# Patient Record
Sex: Female | Born: 1980
Health system: Southern US, Community
[De-identification: ages and names within clinical notes are randomized; demographics above are authoritative.]

## PROBLEM LIST (undated history)

## (undated) DIAGNOSIS — E079 Disorder of thyroid, unspecified: Secondary | ICD-10-CM

## (undated) DIAGNOSIS — J45909 Unspecified asthma, uncomplicated: Secondary | ICD-10-CM

## (undated) HISTORY — PX: CERVICAL BIOPSY  W/ LOOP ELECTRODE EXCISION: SUR135

## (undated) HISTORY — PX: LASIK: SHX215

## (undated) HISTORY — PX: OTHER SURGICAL HISTORY: SHX169

## (undated) HISTORY — PX: WISDOM TOOTH EXTRACTION: SHX21

---

## 2000-05-16 ENCOUNTER — Inpatient Hospital Stay (HOSPITAL_COMMUNITY): Admission: AD | Admit: 2000-05-16 | Discharge: 2000-05-16 | Payer: Self-pay | Admitting: Obstetrics & Gynecology

## 2000-07-19 ENCOUNTER — Encounter: Payer: Self-pay | Admitting: Emergency Medicine

## 2000-07-19 ENCOUNTER — Emergency Department (HOSPITAL_COMMUNITY): Admission: EM | Admit: 2000-07-19 | Discharge: 2000-07-19 | Payer: Self-pay | Admitting: Emergency Medicine

## 2000-08-14 ENCOUNTER — Ambulatory Visit (HOSPITAL_COMMUNITY): Admission: RE | Admit: 2000-08-14 | Discharge: 2000-08-14 | Payer: Self-pay | Admitting: *Deleted

## 2000-10-28 ENCOUNTER — Inpatient Hospital Stay (HOSPITAL_COMMUNITY): Admission: AD | Admit: 2000-10-28 | Discharge: 2000-10-28 | Payer: Self-pay | Admitting: *Deleted

## 2000-12-11 ENCOUNTER — Inpatient Hospital Stay (HOSPITAL_COMMUNITY): Admission: AD | Admit: 2000-12-11 | Discharge: 2000-12-11 | Payer: Self-pay | Admitting: Obstetrics

## 2000-12-27 ENCOUNTER — Ambulatory Visit (HOSPITAL_COMMUNITY): Admission: RE | Admit: 2000-12-27 | Discharge: 2000-12-27 | Payer: Self-pay | Admitting: *Deleted

## 2001-01-02 ENCOUNTER — Encounter (HOSPITAL_COMMUNITY): Admission: RE | Admit: 2001-01-02 | Discharge: 2001-01-18 | Payer: Self-pay | Admitting: Obstetrics & Gynecology

## 2001-01-16 ENCOUNTER — Inpatient Hospital Stay (HOSPITAL_COMMUNITY): Admission: AD | Admit: 2001-01-16 | Discharge: 2001-01-20 | Payer: Self-pay | Admitting: Obstetrics & Gynecology

## 2001-01-16 ENCOUNTER — Encounter (INDEPENDENT_AMBULATORY_CARE_PROVIDER_SITE_OTHER): Payer: Self-pay

## 2001-01-21 ENCOUNTER — Encounter: Admission: RE | Admit: 2001-01-21 | Discharge: 2001-02-20 | Payer: Self-pay | Admitting: Obstetrics & Gynecology

## 2001-01-23 ENCOUNTER — Inpatient Hospital Stay (HOSPITAL_COMMUNITY): Admission: AD | Admit: 2001-01-23 | Discharge: 2001-01-23 | Payer: Self-pay | Admitting: Obstetrics & Gynecology

## 2001-05-07 ENCOUNTER — Inpatient Hospital Stay (HOSPITAL_COMMUNITY): Admission: AD | Admit: 2001-05-07 | Discharge: 2001-05-07 | Payer: Self-pay | Admitting: Obstetrics

## 2001-05-07 ENCOUNTER — Encounter: Payer: Self-pay | Admitting: Obstetrics

## 2001-07-04 ENCOUNTER — Emergency Department (HOSPITAL_COMMUNITY): Admission: EM | Admit: 2001-07-04 | Discharge: 2001-07-04 | Payer: Self-pay | Admitting: Emergency Medicine

## 2001-07-31 ENCOUNTER — Emergency Department (HOSPITAL_COMMUNITY): Admission: EM | Admit: 2001-07-31 | Discharge: 2001-07-31 | Payer: Self-pay | Admitting: Emergency Medicine

## 2001-08-25 ENCOUNTER — Other Ambulatory Visit: Admission: RE | Admit: 2001-08-25 | Discharge: 2001-08-25 | Payer: Self-pay | Admitting: Obstetrics and Gynecology

## 2001-09-10 ENCOUNTER — Encounter: Admission: RE | Admit: 2001-09-10 | Discharge: 2001-12-09 | Payer: Self-pay | Admitting: Obstetrics and Gynecology

## 2001-10-20 ENCOUNTER — Inpatient Hospital Stay (HOSPITAL_COMMUNITY): Admission: AD | Admit: 2001-10-20 | Discharge: 2001-10-20 | Payer: Self-pay | Admitting: Obstetrics and Gynecology

## 2001-11-06 ENCOUNTER — Encounter (HOSPITAL_COMMUNITY): Admission: RE | Admit: 2001-11-06 | Discharge: 2001-12-03 | Payer: Self-pay | Admitting: Obstetrics and Gynecology

## 2001-11-20 ENCOUNTER — Encounter: Payer: Self-pay | Admitting: Obstetrics and Gynecology

## 2001-11-24 ENCOUNTER — Inpatient Hospital Stay (HOSPITAL_COMMUNITY): Admission: AD | Admit: 2001-11-24 | Discharge: 2001-11-24 | Payer: Self-pay | Admitting: Obstetrics and Gynecology

## 2001-12-03 ENCOUNTER — Encounter: Payer: Self-pay | Admitting: Obstetrics and Gynecology

## 2001-12-04 ENCOUNTER — Encounter (INDEPENDENT_AMBULATORY_CARE_PROVIDER_SITE_OTHER): Payer: Self-pay | Admitting: Specialist

## 2001-12-04 ENCOUNTER — Inpatient Hospital Stay (HOSPITAL_COMMUNITY): Admission: AD | Admit: 2001-12-04 | Discharge: 2001-12-06 | Payer: Self-pay | Admitting: Obstetrics and Gynecology

## 2002-09-10 ENCOUNTER — Emergency Department (HOSPITAL_COMMUNITY): Admission: EM | Admit: 2002-09-10 | Discharge: 2002-09-10 | Payer: Self-pay | Admitting: Emergency Medicine

## 2002-09-10 ENCOUNTER — Encounter: Payer: Self-pay | Admitting: Emergency Medicine

## 2003-03-13 ENCOUNTER — Emergency Department (HOSPITAL_COMMUNITY): Admission: EM | Admit: 2003-03-13 | Discharge: 2003-03-14 | Payer: Self-pay | Admitting: Emergency Medicine

## 2003-03-14 ENCOUNTER — Encounter: Payer: Self-pay | Admitting: Emergency Medicine

## 2003-04-01 ENCOUNTER — Other Ambulatory Visit: Admission: RE | Admit: 2003-04-01 | Discharge: 2003-04-01 | Payer: Self-pay | Admitting: Obstetrics and Gynecology

## 2003-04-02 ENCOUNTER — Other Ambulatory Visit: Admission: RE | Admit: 2003-04-02 | Discharge: 2003-04-02 | Payer: Self-pay | Admitting: Gynecology

## 2003-07-27 ENCOUNTER — Ambulatory Visit (HOSPITAL_COMMUNITY): Admission: RE | Admit: 2003-07-27 | Discharge: 2003-07-27 | Payer: Self-pay | Admitting: Obstetrics and Gynecology

## 2003-07-27 ENCOUNTER — Other Ambulatory Visit: Admission: RE | Admit: 2003-07-27 | Discharge: 2003-07-27 | Payer: Self-pay | Admitting: Obstetrics and Gynecology

## 2003-09-08 ENCOUNTER — Ambulatory Visit (HOSPITAL_COMMUNITY): Admission: RE | Admit: 2003-09-08 | Discharge: 2003-09-08 | Payer: Self-pay | Admitting: Obstetrics and Gynecology

## 2003-10-01 ENCOUNTER — Inpatient Hospital Stay (HOSPITAL_COMMUNITY): Admission: AD | Admit: 2003-10-01 | Discharge: 2003-10-04 | Payer: Self-pay | Admitting: Obstetrics and Gynecology

## 2004-02-21 ENCOUNTER — Emergency Department (HOSPITAL_COMMUNITY): Admission: EM | Admit: 2004-02-21 | Discharge: 2004-02-21 | Payer: Self-pay | Admitting: Emergency Medicine

## 2004-12-25 ENCOUNTER — Inpatient Hospital Stay (HOSPITAL_COMMUNITY): Admission: AD | Admit: 2004-12-25 | Discharge: 2004-12-25 | Payer: Self-pay | Admitting: Obstetrics & Gynecology

## 2005-01-30 ENCOUNTER — Inpatient Hospital Stay (HOSPITAL_COMMUNITY): Admission: AD | Admit: 2005-01-30 | Discharge: 2005-01-30 | Payer: Self-pay | Admitting: *Deleted

## 2005-04-01 ENCOUNTER — Ambulatory Visit: Payer: Self-pay | Admitting: Certified Nurse Midwife

## 2005-04-01 ENCOUNTER — Inpatient Hospital Stay (HOSPITAL_COMMUNITY): Admission: AD | Admit: 2005-04-01 | Discharge: 2005-04-01 | Payer: Self-pay | Admitting: *Deleted

## 2005-04-03 ENCOUNTER — Ambulatory Visit: Payer: Self-pay | Admitting: *Deleted

## 2005-04-08 ENCOUNTER — Inpatient Hospital Stay (HOSPITAL_COMMUNITY): Admission: AD | Admit: 2005-04-08 | Discharge: 2005-04-11 | Payer: Self-pay | Admitting: Obstetrics & Gynecology

## 2005-04-08 ENCOUNTER — Encounter (INDEPENDENT_AMBULATORY_CARE_PROVIDER_SITE_OTHER): Payer: Self-pay | Admitting: *Deleted

## 2005-04-08 ENCOUNTER — Ambulatory Visit: Payer: Self-pay | Admitting: Obstetrics & Gynecology

## 2005-05-10 ENCOUNTER — Encounter: Payer: Self-pay | Admitting: Family Medicine

## 2005-05-10 ENCOUNTER — Ambulatory Visit: Payer: Self-pay | Admitting: Family Medicine

## 2005-05-14 ENCOUNTER — Inpatient Hospital Stay (HOSPITAL_COMMUNITY): Admission: AD | Admit: 2005-05-14 | Discharge: 2005-05-14 | Payer: Self-pay | Admitting: Obstetrics and Gynecology

## 2005-05-16 ENCOUNTER — Ambulatory Visit: Payer: Self-pay | Admitting: Obstetrics & Gynecology

## 2005-05-16 ENCOUNTER — Inpatient Hospital Stay (HOSPITAL_COMMUNITY): Admission: AD | Admit: 2005-05-16 | Discharge: 2005-05-16 | Payer: Self-pay | Admitting: Obstetrics & Gynecology

## 2005-05-23 ENCOUNTER — Ambulatory Visit: Payer: Self-pay | Admitting: Family Medicine

## 2005-06-20 ENCOUNTER — Ambulatory Visit: Payer: Self-pay | Admitting: Family Medicine

## 2005-06-25 ENCOUNTER — Ambulatory Visit: Payer: Self-pay | Admitting: Sports Medicine

## 2005-09-18 ENCOUNTER — Ambulatory Visit: Payer: Self-pay | Admitting: Family Medicine

## 2006-06-03 ENCOUNTER — Ambulatory Visit: Payer: Self-pay | Admitting: Family Medicine

## 2006-06-04 ENCOUNTER — Ambulatory Visit: Payer: Self-pay | Admitting: Family Medicine

## 2006-06-27 ENCOUNTER — Ambulatory Visit: Payer: Self-pay | Admitting: Sports Medicine

## 2006-07-29 ENCOUNTER — Ambulatory Visit: Payer: Self-pay | Admitting: Family Medicine

## 2006-08-20 ENCOUNTER — Emergency Department (HOSPITAL_COMMUNITY): Admission: EM | Admit: 2006-08-20 | Discharge: 2006-08-20 | Payer: Self-pay | Admitting: Emergency Medicine

## 2006-10-17 ENCOUNTER — Ambulatory Visit: Payer: Self-pay | Admitting: Sports Medicine

## 2006-12-05 DIAGNOSIS — D509 Iron deficiency anemia, unspecified: Secondary | ICD-10-CM

## 2006-12-05 DIAGNOSIS — F329 Major depressive disorder, single episode, unspecified: Secondary | ICD-10-CM

## 2006-12-05 DIAGNOSIS — J309 Allergic rhinitis, unspecified: Secondary | ICD-10-CM | POA: Insufficient documentation

## 2006-12-05 DIAGNOSIS — L708 Other acne: Secondary | ICD-10-CM | POA: Insufficient documentation

## 2006-12-05 DIAGNOSIS — F411 Generalized anxiety disorder: Secondary | ICD-10-CM | POA: Insufficient documentation

## 2007-05-22 ENCOUNTER — Emergency Department (HOSPITAL_COMMUNITY): Admission: EM | Admit: 2007-05-22 | Discharge: 2007-05-22 | Payer: Self-pay | Admitting: Emergency Medicine

## 2007-08-01 ENCOUNTER — Encounter (INDEPENDENT_AMBULATORY_CARE_PROVIDER_SITE_OTHER): Payer: Self-pay | Admitting: Family Medicine

## 2007-08-21 ENCOUNTER — Encounter (INDEPENDENT_AMBULATORY_CARE_PROVIDER_SITE_OTHER): Payer: Self-pay | Admitting: Family Medicine

## 2007-08-28 ENCOUNTER — Encounter (INDEPENDENT_AMBULATORY_CARE_PROVIDER_SITE_OTHER): Payer: Self-pay | Admitting: Family Medicine

## 2007-08-28 ENCOUNTER — Ambulatory Visit: Payer: Self-pay | Admitting: Family Medicine

## 2007-08-28 LAB — CONVERTED CEMR LAB
ALT: 10 units/L (ref 0–35)
AST: 14 units/L (ref 0–37)
Albumin: 4.6 g/dL (ref 3.5–5.2)
BUN: 9 mg/dL (ref 6–23)
Basophils Absolute: 0 10*3/uL (ref 0.0–0.1)
Basophils Relative: 1 % (ref 0–1)
Calcium: 9.6 mg/dL (ref 8.4–10.5)
Chloride: 105 meq/L (ref 96–112)
Creatinine, Ser: 0.72 mg/dL (ref 0.40–1.20)
Eosinophils Relative: 2 % (ref 0–5)
Glucose, Urine, Semiquant: NEGATIVE
HCT: 45 % (ref 36.0–46.0)
Ketones, urine, test strip: NEGATIVE
MCHC: 34 g/dL (ref 30.0–36.0)
MCV: 90 fL (ref 78.0–100.0)
Neutro Abs: 5.5 10*3/uL (ref 1.7–7.7)
Neutrophils Relative %: 62 % (ref 43–77)
Nitrite: POSITIVE
Platelets: 215 10*3/uL (ref 150–400)
Potassium: 4.3 meq/L (ref 3.5–5.3)
Sodium: 137 meq/L (ref 135–145)
WBC: 8.7 10*3/uL (ref 4.0–10.5)

## 2007-08-29 ENCOUNTER — Encounter: Admission: RE | Admit: 2007-08-29 | Discharge: 2007-08-29 | Payer: Self-pay | Admitting: Sports Medicine

## 2007-09-01 ENCOUNTER — Telehealth: Payer: Self-pay | Admitting: *Deleted

## 2007-09-03 ENCOUNTER — Ambulatory Visit: Payer: Self-pay | Admitting: Family Medicine

## 2007-09-03 ENCOUNTER — Encounter (INDEPENDENT_AMBULATORY_CARE_PROVIDER_SITE_OTHER): Payer: Self-pay | Admitting: Family Medicine

## 2007-09-03 DIAGNOSIS — F17200 Nicotine dependence, unspecified, uncomplicated: Secondary | ICD-10-CM

## 2007-09-03 LAB — CONVERTED CEMR LAB
ALT: 10 units/L (ref 0–35)
Albumin: 4.5 g/dL (ref 3.5–5.2)
Chlamydia, DNA Probe: NEGATIVE
Ferritin: 55 ng/mL (ref 10–291)
GC Probe Amp, Genital: NEGATIVE
HCT: 41.5 % (ref 36.0–46.0)
Hemoglobin: 14.2 g/dL (ref 12.0–15.0)
Iron: 96 ug/dL (ref 42–145)
MCHC: 34.2 g/dL (ref 30.0–36.0)
Potassium: 4.4 meq/L (ref 3.5–5.3)
RDW: 12.1 % (ref 11.5–15.5)
Sodium: 139 meq/L (ref 135–145)
Total Bilirubin: 0.3 mg/dL (ref 0.3–1.2)

## 2008-01-14 ENCOUNTER — Telehealth (INDEPENDENT_AMBULATORY_CARE_PROVIDER_SITE_OTHER): Payer: Self-pay | Admitting: *Deleted

## 2008-01-14 ENCOUNTER — Telehealth (INDEPENDENT_AMBULATORY_CARE_PROVIDER_SITE_OTHER): Payer: Self-pay | Admitting: Family Medicine

## 2008-01-14 ENCOUNTER — Encounter: Admission: RE | Admit: 2008-01-14 | Discharge: 2008-01-14 | Payer: Self-pay | Admitting: Family Medicine

## 2008-01-14 ENCOUNTER — Ambulatory Visit: Payer: Self-pay | Admitting: Sports Medicine

## 2008-01-21 ENCOUNTER — Ambulatory Visit: Payer: Self-pay | Admitting: Family Medicine

## 2008-02-03 ENCOUNTER — Encounter (INDEPENDENT_AMBULATORY_CARE_PROVIDER_SITE_OTHER): Payer: Self-pay | Admitting: Family Medicine

## 2008-04-02 ENCOUNTER — Emergency Department (HOSPITAL_COMMUNITY): Admission: EM | Admit: 2008-04-02 | Discharge: 2008-04-03 | Payer: Self-pay | Admitting: Emergency Medicine

## 2008-08-20 ENCOUNTER — Inpatient Hospital Stay (HOSPITAL_COMMUNITY): Admission: EM | Admit: 2008-08-20 | Discharge: 2008-08-21 | Payer: Self-pay | Admitting: Emergency Medicine

## 2008-08-20 ENCOUNTER — Encounter: Payer: Self-pay | Admitting: Family Medicine

## 2008-08-20 ENCOUNTER — Encounter: Payer: Self-pay | Admitting: Emergency Medicine

## 2008-08-21 ENCOUNTER — Encounter: Payer: Self-pay | Admitting: Family Medicine

## 2008-08-21 LAB — CONVERTED CEMR LAB
Creatinine, Ser: 0.63 mg/dL
LDL Cholesterol: 86 mg/dL
Triglycerides: 80 mg/dL

## 2008-08-26 ENCOUNTER — Ambulatory Visit: Payer: Self-pay | Admitting: Family Medicine

## 2008-08-26 ENCOUNTER — Encounter: Payer: Self-pay | Admitting: *Deleted

## 2008-09-25 ENCOUNTER — Encounter: Payer: Self-pay | Admitting: Family Medicine

## 2008-09-27 ENCOUNTER — Encounter (INDEPENDENT_AMBULATORY_CARE_PROVIDER_SITE_OTHER): Payer: Self-pay | Admitting: *Deleted

## 2008-10-05 ENCOUNTER — Ambulatory Visit: Payer: Self-pay | Admitting: Family Medicine

## 2008-10-05 ENCOUNTER — Encounter: Payer: Self-pay | Admitting: Family Medicine

## 2008-10-05 ENCOUNTER — Encounter (INDEPENDENT_AMBULATORY_CARE_PROVIDER_SITE_OTHER): Payer: Self-pay | Admitting: Family Medicine

## 2008-10-05 LAB — CONVERTED CEMR LAB
GC Probe Amp, Genital: NEGATIVE
TSH: 1.341 microintl units/mL (ref 0.350–4.50)

## 2008-10-13 ENCOUNTER — Encounter: Payer: Self-pay | Admitting: Family Medicine

## 2009-06-07 ENCOUNTER — Ambulatory Visit: Payer: Self-pay | Admitting: Family Medicine

## 2009-06-07 DIAGNOSIS — B009 Herpesviral infection, unspecified: Secondary | ICD-10-CM | POA: Insufficient documentation

## 2010-03-13 ENCOUNTER — Encounter: Payer: Self-pay | Admitting: Family Medicine

## 2010-03-13 ENCOUNTER — Ambulatory Visit: Payer: Self-pay | Admitting: Family Medicine

## 2010-03-13 DIAGNOSIS — N920 Excessive and frequent menstruation with regular cycle: Secondary | ICD-10-CM

## 2010-03-13 LAB — CONVERTED CEMR LAB
Chlamydia, DNA Probe: NEGATIVE
GC Probe Amp, Genital: NEGATIVE
HCT: 40.9 % (ref 36.0–46.0)
Hemoglobin: 13.8 g/dL (ref 12.0–15.0)
MCHC: 33.7 g/dL (ref 30.0–36.0)
RBC: 4.53 M/uL (ref 3.87–5.11)
RDW: 12.7 % (ref 11.5–15.5)
WBC: 8.9 10*3/uL (ref 4.0–10.5)

## 2010-03-14 ENCOUNTER — Encounter: Payer: Self-pay | Admitting: Family Medicine

## 2010-03-14 ENCOUNTER — Telehealth (INDEPENDENT_AMBULATORY_CARE_PROVIDER_SITE_OTHER): Payer: Self-pay | Admitting: *Deleted

## 2010-03-17 ENCOUNTER — Telehealth: Payer: Self-pay | Admitting: Family Medicine

## 2010-04-03 ENCOUNTER — Encounter: Payer: Self-pay | Admitting: Family Medicine

## 2010-08-02 ENCOUNTER — Telehealth: Payer: Self-pay | Admitting: *Deleted

## 2010-08-02 ENCOUNTER — Ambulatory Visit: Payer: Self-pay | Admitting: Family Medicine

## 2010-08-02 DIAGNOSIS — N926 Irregular menstruation, unspecified: Secondary | ICD-10-CM | POA: Insufficient documentation

## 2010-08-02 DIAGNOSIS — N939 Abnormal uterine and vaginal bleeding, unspecified: Secondary | ICD-10-CM

## 2010-08-02 DIAGNOSIS — R87619 Unspecified abnormal cytological findings in specimens from cervix uteri: Secondary | ICD-10-CM

## 2010-08-02 LAB — CONVERTED CEMR LAB: Beta hcg, urine, semiquantitative: NEGATIVE

## 2010-09-07 ENCOUNTER — Ambulatory Visit: Payer: Self-pay | Admitting: Family Medicine

## 2010-09-21 ENCOUNTER — Encounter: Payer: Self-pay | Admitting: Family Medicine

## 2010-09-21 ENCOUNTER — Ambulatory Visit: Payer: Self-pay | Admitting: Family Medicine

## 2010-09-21 LAB — CONVERTED CEMR LAB: Pap Smear: UNDETERMINED

## 2010-09-22 ENCOUNTER — Encounter: Payer: Self-pay | Admitting: Family Medicine

## 2010-09-26 ENCOUNTER — Encounter: Payer: Self-pay | Admitting: Family Medicine

## 2010-09-27 ENCOUNTER — Telehealth: Payer: Self-pay | Admitting: *Deleted

## 2010-11-07 NOTE — Assessment & Plan Note (Signed)
Summary: cpe,df   Vital Signs:  Patient profile:   30 year old female Height:      63.3 inches Weight:      121 pounds BMI:     21.31 Temp:     98.3 degrees F oral Pulse rate:   83 / minute BP sitting:   116 / 75  (left arm)  Vitals Entered By: Terese Door (March 13, 2010 2:54 PM) CC: CPE, smoking, periods, anxiety Is Patient Diabetic? No Pain Assessment Patient in pain? no        CC:  CPE, smoking, periods, and anxiety.  History of Present Illness: 1.  smoking--would like to try chantix again  2.  heavy periods-- cycles every 20 days and very heavy.  been going on about 1 year.  having some lightheadedness and fatigue.  takes an iron pill weekly  3.  anxiety--stopped taking celexa.  does well with occ xanax for panic attack.  does not take often  Habits & Providers  Alcohol-Tobacco-Diet     Tobacco Status: current     Tobacco Counseling: to quit use of tobacco products     Cigarette Packs/Day: 1.0  Current Medications (verified): 1)  Ventolin Hfa 108 (90 Base) Mcg/act  Aers (Albuterol Sulfate) .... 2 - 4 Puffs As Needed For Wheezing. 2)  Fluticasone Propionate 50 Mcg/act  Susp (Fluticasone Propionate) .... 2 Sprays Into Each Nostril Daily For Rhinitis, Sinusitis 3)  Alprazolam 0.25 Mg Tabs (Alprazolam) .Marland Kitchen.. 1 Tab By Mouth Q 8 Hours As Needed Panic Attack 4)  Valacyclovir Hcl 1 Gm Tabs (Valacyclovir Hcl) .... Take 2 Tabs By Mouth X1 At The First Sign of Fever Blister  Allergies: 1)  Penicillin G Potassium (Penicillin G Potassium)  Past History:  Past Medical History: hx panic attacks (last one about 1.5 years ago) hx of anemia asthma allergic rhinitis fever blisters  Past Surgical History: Reviewed history from 08/26/2008 and no changes required. BTL - 04/07/2005 with last c/s, LTCS - X 3, wisdom teeth removal, lasix  Family History: Father- Asthma.   MatGF - DM, HTN, MI, CVA.   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  regular exercise.  Smoking Status:  current Packs/Day:  1.0   Impression & Recommendations:  Problem # 1:  SEXUALLY TRANSMITTED DISEASE, EXPOSURE TO (ICD-V01.6) Assessment Unchanged  check labs  Orders: GC/Chlamydia-[facility] ([id number]) HIV-[facility] ([id number]) RPR-[facility] [id number]) Wet Prep- [facility] (13086) [facility] - Est  18-39 yrs ([address])  Problem # 2:  TOBACCO ABUSE (ICD-305.1) Assessment: Unchanged try chantix again.  advised of side effects Her updated medication list for this problem includes:    Chantix Starting Month Pak 0.5 Mg X 11 & 1 Mg X 42 Tabs (Varenicline tartrate) .Marland Kitchen... Take 0.5 mg daily for 3 days.  then 0.5 two times a day for 4 days.  then 1mg  daily; dispense 1 starter pack    Chantix 1 Mg Tabs (Varenicline tartrate) .Marland Kitchen... 1 tab by mouth daily after you finish starter pack  Problem # 3:  ANXIETY (ICD-300.00) Assessment: Unchanged continue xanax The following medications were removed from the medication list:    Citalopram Hydrobromide 40 Mg Tabs (Citalopram hydrobromide) .Marland Kitchen... 1/2 tab by mouth daily for 1 week; then 1 whole tab by mouth daily Her updated medication list for this problem includes:    Alprazolam 0.25 Mg Tabs (Alprazolam) .Marland KitchenMarland KitchenMarland KitchenMarland Kitchen  1 tab by mouth q 8 hours as needed panic attack  Problem # 4:  ANEMIA, IRON DEFICIENCY, UNSPEC. (ICD-280.9) Assessment: Unchanged  check cbc  Orders: CBC-[facility] ([address])  Problem # 5:  MENORRHAGIA (ICD-626.2) Assessment: New discussed options.  pt would like to try mirena.  she prefers this over ocps.  she will schedule appt  Complete Medication List: 1)  Ventolin Hfa 108 (90 Base) Mcg/act Aers (Albuterol sulfate) .... 2 - 4 puffs as needed for wheezing. 2)  Fluticasone Propionate 50 Mcg/act Susp (Fluticasone propionate) .... 2 sprays into each nostril daily for rhinitis, sinusitis 3)  Alprazolam  0.25 Mg Tabs (Alprazolam) .Marland Kitchen.. 1 tab by mouth q 8 hours as needed panic attack 4)  Valacyclovir Hcl 1 Gm Tabs (Valacyclovir hcl) .... Take 2 tabs by mouth x1 at the first sign of fever blister 5)  Chantix Starting Month Pak 0.5 Mg X 11 & 1 Mg X 42 Tabs (Varenicline tartrate) .... Take 0.5 mg daily for 3 days.  then 0.5 two times a day for 4 days.  then 1mg  daily; dispense 1 starter pack 6)  Chantix 1 Mg Tabs (Varenicline tartrate) .Marland Kitchen.. 1 tab by mouth daily after you finish starter pack  Other Orders: Pap Smear-[facility] ([id number])  Patient Instructions: 1)  It was nice to see you today. 2)  Make an appointment for an IUD at your convenience 3)  It is great you are going to quit smoking.  I prescribed you chantix for a 6 month supply.  Start with the starter pack and then fill the maintenance pack. 4)  I will call you if your tests are positive. 5)  Please schedule a follow-up appointment in 3 months for smoking.  Prescriptions: CHANTIX 1 MG TABS (VARENICLINE TARTRATE) 1 tab by mouth daily after you finish starter pack  #30 x 4   Entered and Authorized by:   Asher Muir MD   Signed by:   Asher Muir MD on 03/13/2010   Method used:   Electronically to        [facility] [phone]* (retail)       [address]       [place], [place]  [address]       Ph: [phone]       Fax: [phone]   RxID:   [id number] CHANTIX STARTING MONTH PAK 0.5 MG X 11 & 1 MG X 42 TABS (VARENICLINE TARTRATE) take 0.5 mg daily for 3 days.  then 0.5 two times a day for 4 days.  then 1mg  daily; dispense 1 starter pack  #1 x 0   Entered and Authorized by:   Asher Muir MD   Signed by:   Asher Muir MD on 03/13/2010   Method used:   Electronically to        [facility] [address] [phone]* (retail)       [address]       [place], [place]  [address]       Ph: [phone]       Fax: [phone]   RxID:   [id number] ALPRAZOLAM 0.25 MG TABS (ALPRAZOLAM) 1 tab by mouth q 8 hours as needed panic  attack  #45 x 3   Entered and Authorized by:   Asher Muir MD   Signed by:   Asher Muir MD on 03/13/2010   Method used:   Print then Give to Patient   RxID:   [id number]   Laboratory Results  Date/Time Received: March 13, 2010 4:00 PM  Date/Time Reported: March 13, 2010 4:17 PM   [employer] Source: vag WBC/hpf: 10-20 Bacteria/hpf: 3+  Rods Clue cells/hpf: none  Negative whiff Yeast/hpf: none Trichomonas/hpf: none Comments: ...............test performed by......Marland KitchenBonnie A. [place]azil[place], MLS (ASCP)cm

## 2010-11-07 NOTE — Progress Notes (Signed)
  Phone Note Outgoing Call   Call placed by: Asher Muir MD,  March 17, 2010 2:10 PM Summary of Call: Called pt to let her know about abnormal pap smear results.  Advised her to schedule a colposcopy in Dr Donnetta Hail clinic.  Pt expressed understanding and will schedule appt Initial call taken by: Asher Muir MD,  March 17, 2010 2:11 PM

## 2010-11-07 NOTE — Progress Notes (Signed)
Summary: Rx Req  Phone Note Call from Patient Call back at Home Phone [phone]   Caller: Patient Summary of Call: Pt found out that her insurance does not cover iud.  So she would like to have the pill called in for her to [facility] [place]. Initial call taken by: Clydell Hakim,  March 14, 2010 8:55 AM  Follow-up for Phone Call        prescription sent Follow-up by: Asher Muir MD,  March 14, 2010 9:47 AM  Additional Follow-up for Phone Call Additional follow up Details #1::        Message left on machine that rx sent in. Additional Follow-up by: Starleen Blue RN,  March 14, 2010 2:22 PM    New/Updated Medications: ARANELLE 0.5/1/0.5-35 MG-MCG TABS (NORETHIN-ETH ESTRAD TRIPHASIC) 1 tab by mouth daily; dispense 3 packs Prescriptions: ARANELLE 0.5/1/0.5-35 MG-MCG TABS (NORETHIN-ETH ESTRAD TRIPHASIC) 1 tab by mouth daily; dispense 3 packs  #1 x 12   Entered and Authorized by:   Asher Muir MD   Signed by:   Asher Muir MD on 03/14/2010   Method used:   Electronically to        [facility] [phone]* (retail)       [address]       [place], [place]  [address]       Ph: [phone]       Fax: [phone]   RxID:   [id number]

## 2010-11-07 NOTE — Letter (Signed)
Summary: Generic Letter  [facility]  [address]   [place], [place] [address]   Phone: [phone]  Fax: [phone]    03/14/2010  Jenny Poole [address] [place], [place]  [address]  Dear Ms. Marciano,   I just wanted to let you know that your lab results were normal.  Please call me if you have any questions or concerns.           Sincerely,   Asher Muir MD  Appended Document: Generic Letter mailed.

## 2010-11-07 NOTE — Letter (Signed)
Summary: Generic Letter  [facility]  [address]   [place], [place] [address]   Phone: [phone]  Fax: [phone]    04/03/2010  ZERLINE MELCHIOR [address] [place], [place]  [address]  Dear Ms. Mcgeehan,    I just wanted to remind you to schedule your colposcopy with our office [phone]).  It is very important to for your health.  Please call me if you have any questions or concerns.           Sincerely,   Asher Muir MD  Appended Document: Generic Letter mailed

## 2010-11-07 NOTE — Progress Notes (Signed)
Summary: triage  Phone Note Call from Patient   Caller: Patient Call For: [phone](wk #) Summary of Call: Pt  has been having vaginal bleed x 10 days.  Is concerned because she has never had a cycle like this before.  Please advise what she should do  Initial call taken by: Abundio Miu,  August 02, 2010 10:18 AM  Follow-up for Phone Call        Normally her period lasts no more than 4 days.  Is having to change tampons every hour to 1.5 hours.  Does not use anything for [facility].  Gave her an appt with her PCP this afternoon. Follow-up by: Dennison Nancy RN,  August 02, 2010 10:28 AM

## 2010-11-07 NOTE — Assessment & Plan Note (Signed)
Summary: Heavy menses/kf   Vital Signs:  Patient profile:   30 year old female Menstrual status:  irregular LMP:     06/08/2010 Height:      63.3 inches Weight:      121 pounds BMI:     21.31 Temp:     98.7 degrees F oral Pulse rate:   102 / minute BP sitting:   123 / 79  (right arm) Cuff size:   regular  Vitals Entered By: Jimmy Footman, CMA (August 02, 2010 1:39 PM) CC: menses x10 days Is Patient Diabetic? No LMP (date): 06/08/2010     Menstrual Status irregular Enter LMP: 06/08/2010 Last PAP Result ATYPICAL GLANDULAR CELLS, SEE COMMENT.   Primary Charrisse Masley:  Lloyd Huger MD  CC:  menses x10 days.  History of Present Illness: 1. Vaginal bleeding for past 10 days. LMP was beginning of september (6 weeks ago). Normally has periods every 20 days for past year with moderate bleeding for 4 days.  Associated with cramping, nausea last week, no abdominal pain. No OCPs currently (stopped due to side effects). Has been pregnant 4 times previously with 3 c-sections. No hx of fibroids or atypical bleeding/bruising previously.  2. Abnormal pap smear: In june 2011 had atypical glandular cells, no f/u colposcopy scheduled thus far. Has hx of colposcopy over 2 years ago at [facility] facility, normal results. Normal pap in 2009.   Preventive Screening-Counseling & Management  Alcohol-Tobacco     Smoking Status: current  Allergies: 1)  Penicillin G Potassium (Penicillin G Potassium)  Past History:  Past Medical History: Last updated: 03/13/2010 hx panic attacks (last one about 1.5 years ago) hx of anemia asthma allergic rhinitis fever blisters  Past Surgical History: Last updated: 08/26/2008 BTL - 04/07/2005 with last c/s, LTCS - X 3, wisdom teeth removal, lasix  Family History: Last updated: 03/13/2010 Father- Asthma.   MatGF - DM, HTN, MI, CVA.   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  regular exercise.    Risk Factors: Smoking Status: current (08/02/2010) Packs/Day: 1.0 (03/13/2010)  Review of Systems       The patient complains of abnormal bleeding.  The patient denies anorexia, fever, vision loss, chest pain, syncope, dyspnea on exertion, prolonged cough, headaches, abdominal pain, genital sores, and difficulty walking.    Physical Exam  General:  Well-developed,well-nourished, alert,appropriate and cooperative throughout examination.  Appears anxious. Head:  Normocephalic and atraumatic without obvious abnormalities.  Eyes:  PERRLA, EOMI Mouth:  Oral mucosa and oropharynx without lesions or exudates.  Teeth in good repair. Lungs:  Normal respiratory effort, chest expands symmetrically. Lungs are clear to auscultation, no crackles or wheezes. Heart:  Normal rate and regular rhythm. S1 and S2 normal without gallop, murmur, click, rub or other extra sounds. Abdomen:  Bowel sounds positive,abdomen soft and non-tender without masses, organomegaly or hernias noted. Rectal:  No external abnormalities noted.  No rectal masses or tenderness. Genitalia:  Normal introitus for age, no external lesions, no vaginal discharge, mucosa pink and moist, no vaginal or cervical lesions, no vaginal atrophy,  normal uterus size and position. Blood at cercival os which appears closed. Mild left cervical motion tenderness, no masses palpated.  Msk:  No deformity or scoliosis noted of thoracic or lumbar spine.   Pulses:  DP pulses intact bilaterally.  Extremities:  No  clubbing, cyanosis, edema, or deformity noted with normal full range of motion of all joints.   Neurologic:  alert & oriented X3, cranial nerves II-XII intact, strength normal in all extremities, and sensation intact to light touch.   Skin:  Intact without suspicious lesions or rashes Psych:  flat affect and slightly anxious.      Impression & Recommendations:  Problem # 1:  VAGINAL BLEEDING, ABNORMAL (ICD-626.9) Assessment New  Pregnancy test negative. Infection not likely a cause of bleeding given her recent negative GC/Chla.  Most likely anovulatory bleeding with irregularity of cycle. Hemodynamically stable and not complaining of acute signs of anemia, so will treat with 7 days provera (avoiding estrogens due to recent hx of atypical glandular cells on pap). Advised patient to call if bleeding does not improve in next several days. Will consider transvaginal ultrasound to evaluate for any pelvic pathology (cyst/fibroid) if symptoms persist. Advised patient of importance of ruling out malignancy with colpo and endometrial bx which she has not scheduled since her abnormal pap in June 2011 showed atypical glandular cells.  The following medications were removed from the medication list:    Aranelle 0.5/1/0.5-35 Mg-mcg Tabs (Norethin-eth estrad triphasic) .Marland Kitchen... 1 tab by mouth daily; dispense 3 packs Her updated medication list for this problem includes:    Provera 10 Mg Tabs (Medroxyprogesterone acetate) .Marland Kitchen... Take one tab daily for 7 days.  Orders: [facility]- Est  Level 4 ([address])  Problem # 2:  ABNORMAL GLANDULAR PAPANICOLAOU SMEAR OF CERVIX (ICD-795.00) Assessment: New  Will f/u with Dr. Jennette Kettle for colpo/endometrial bx due to AGUS. Most likely unrelated to her current bleeding, but will defer these procedures for the immediate next 2 weeks after completing her progesterone treatment since this may hinder sample acquisition.   Orders: [facility]- Est  Level 4 ([address])  Complete Medication List: 1)  Ventolin Hfa 108 (90 Base) Mcg/act Aers (Albuterol sulfate) .... 2 - 4 puffs as needed for wheezing. 2)  Fluticasone Propionate 50 Mcg/act Susp (Fluticasone propionate) .... 2 sprays into each nostril daily for rhinitis, sinusitis 3)  Alprazolam 0.25 Mg Tabs (Alprazolam) .Marland Kitchen.. 1 tab by mouth q 8 hours as needed panic attack 4)   Valacyclovir Hcl 1 Gm Tabs (Valacyclovir hcl) .... Take 2 tabs by mouth x1 at the first sign of fever blister 5)  Chantix Starting Month Pak 0.5 Mg X 11 & 1 Mg X 42 Tabs (Varenicline tartrate) .... Take 0.5 mg daily for 3 days.  then 0.5 two times a day for 4 days.  then 1mg  daily; dispense 1 starter pack 6)  Chantix 1 Mg Tabs (Varenicline tartrate) .Marland Kitchen.. 1 tab by mouth daily after you finish starter pack 7)  Provera 10 Mg Tabs (Medroxyprogesterone acetate) .... Take one tab daily for 7 days. 8)  Naproxen 500 Mg Tabs (Naproxen) .... Take one tab twice daily as needed for cramping/pain.  Other Orders: U Preg-[facility] ([address]08)  Patient Instructions: 1)  Nice to meet you. 2)  Please schedule an appointment for colposcopy and endometrial biopsy with Dr. Jennette Kettle after 2 weeks (between Nov 9-Nov 30) due to your abnormal pap smear. 3)  Your bleeding should slow down in the next couple days. 4)  Call if it does not improve.  Prescriptions: NAPROXEN 500 MG TABS (NAPROXEN) Take one tab twice daily as needed for cramping/pain.  #30 x 0   Entered and Authorized by:   Lloyd Huger MD   Signed by:   Lloyd Huger MD on 08/02/2010   Method used:  Print then Give to Patient   RxID:   [id number] PROVERA 10 MG TABS (MEDROXYPROGESTERONE ACETATE) Take one tab daily for 7 days.  #7 x 0   Entered and Authorized by:   Lloyd Huger MD   Signed by:   Lloyd Huger MD on 08/02/2010   Method used:   Print then Give to Patient   RxID:   [id number] PROVERA 10 MG TABS (MEDROXYPROGESTERONE ACETATE) Take one tab daily for 7 days.  #7 x 0   Entered and Authorized by:   Lloyd Huger MD   Signed by:   Lloyd Huger MD on 08/02/2010   Method used:   Print then Give to Patient   RxID:   [id number] NAPROXEN 500 MG TABS (NAPROXEN) Take one tab twice daily as needed for cramping/pain.  #30 x 0   Entered and Authorized by:   Lloyd Huger MD   Signed by:   Lloyd Huger MD on 08/02/2010   Method used:   Print then Give to  Patient   RxID:   [id number]    Orders Added: 1)  U Preg-[facility] [810[address]] 2)  [facility]- Est  Level 4 [[address]]    Laboratory Results   Urine Tests  Date/Time Received: August 02, 2010 1:58 PM  Date/Time Reported: August 02, 2010 2:04 PM     Urine HCG: negative Comments: ...............test performed by......Marland KitchenBonnie A. [place], MLS (ASCP)cm

## 2010-11-09 NOTE — Assessment & Plan Note (Signed)
Summary: reschd' visit due to machine breaking down.   Vital Signs:  Patient profile:   30 year old female Menstrual status:  irregular Weight:      122.6 pounds Temp:     98.5 degrees F oral Pulse rate:   101 / minute Pulse rhythm:   regular BP sitting:   118 / 87  (left arm) Cuff size:   regular  Vitals Entered By: Loralee Pacas CMA (September 21, 2010 10:03 AM)  Primary Care Provider:  Lloyd Huger MD   History of Present Illness: Pt. returns for colpo and endometrial biopsy.  She says she has been having heavy menstrul bleeding, with periods lasting about 10 days, and has been passing clots.  She says that intercourse is extremely painful.  Pt. also says she has been feeling fatigued, and sometimes light headed and dizzy.    ROS otherwise negative.   Allergies: 1)  Penicillin G Potassium (Penicillin G Potassium)  Physical Exam  General:  Well-developed,well-nourished,in no acute distress; alert,appropriate and cooperative throughout examination Genitalia:  Normal introitus for age, no external lesions, no vaginal discharge, mucosa pink and moist, small cervical lesion at 4 o'clock position, no vaginal atrophy, no friaility or hemorrhage.  Additional Exam:  Patient given informed consent, signed copy in the chart. Placed in lithotomy position. Cervix viewed with sterile speculum. The portio of the cervix was cleansed with individual betadine swabs. The cervix was secured with a tenaculum placed in the anterior lip. The uterus was measured using sound. Endometrial sample was taken using GynoSampler pipelle. All equipment was removed. The patient tolerated the procedure well and was given post procedure instructions. we will contact her with any pathology result   Patient given informed consent, signed copy in the chart. Placed in lithotomy position. Cervix viewed with speculum and colposcope. Was the entire squamocolumnar junction seen?yes Any acetowhite lesions noted?yes Any  abnormalities seen with green filter?no Any abnormalities seen with application of Lugol's solution?no Was the endocervical canal sampled?yes Were any cervical biopsies taken?yes Were there any complications?no COMMENTS: Patient was given post procedure instructions. We will notify her of  results.    Impression & Recommendations:  Problem # 1:  ABNORMAL GLANDULAR PAPANICOLAOU SMEAR OF CERVIX (ICD-795.00)  colposcopy and endometrial biopsy  Orders: Colposcopy w/ biopsy - [facility] ([address]) Endometrial/Endocerv BX- [facility] ([address])  Complete Medication List: 1)  Ventolin Hfa 108 (90 Base) Mcg/act Aers (Albuterol sulfate) .... 2 - 4 puffs as needed for wheezing. 2)  Fluticasone Propionate 50 Mcg/act Susp (Fluticasone propionate) .... 2 sprays into each nostril daily for rhinitis, sinusitis 3)  Alprazolam 0.25 Mg Tabs (Alprazolam) .Marland Kitchen.. 1 tab by mouth q 8 hours as needed panic attack 4)  Valacyclovir Hcl 1 Gm Tabs (Valacyclovir hcl) .... Take 2 tabs by mouth x1 at the first sign of fever blister 5)  Chantix Starting Month Pak 0.5 Mg X 11 & 1 Mg X 42 Tabs (Varenicline tartrate) .... Take 0.5 mg daily for 3 days.  then 0.5 two times a day for 4 days.  then 1mg  daily; dispense 1 starter pack 6)  Chantix 1 Mg Tabs (Varenicline tartrate) .Marland Kitchen.. 1 tab by mouth daily after you finish starter pack 7)  Provera 10 Mg Tabs (Medroxyprogesterone acetate) .... Take one tab daily for 7 days. 8)  Naproxen 500 Mg Tabs (Naproxen) .... Take one tab twice daily as needed for cramping/pain.  Other Orders: Pap Smear-[facility] ([id number]) Ketorolac-Toradol 15mg  ([id number])   Medication Administration  Injection # 1:  Medication: Ketorolac-Toradol 15mg     Diagnosis: VAGINAL BLEEDING, ABNORMAL (ICD-626.9)    Route: IM    Site: RUOQ gluteus    Exp Date: 03/10/2012    Lot #: [id number]    Mfr: novaplus    Patient tolerated injection without complications    Given by: Loralee Pacas CMA (September 21, 2010 12:17 PM)  Orders  Added: 1)  Pap Smear-[facility] [[id number]] 2)  Ketorolac-Toradol 15mg  [[id number]] 3)  Colposcopy w/ biopsy - [facility] [[address]213] 4)  Endometrial/Endocerv BX- [facility] [[address]]

## 2010-11-09 NOTE — Progress Notes (Signed)
Summary: appt at [facility] GYN  Phone Note Outgoing Call   Call placed by: Loralee Pacas CMA,  September 27, 2010 11:24 AM Summary of Call: lvm for pt to inform her of GYN appt at [facility] 02.10.2012 @ 845  Follow-up for Phone Call        confirmed appt with pt. Follow-up by: Loralee Pacas CMA,  September 27, 2010 11:35 AM

## 2010-11-09 NOTE — Miscellaneous (Signed)
Summary: RESULTS & GYN clinic [facility]  Clinical Lists Changes  Orders: Added new Referral order of Gynecologic Referral (Gyn) - Signed    Part of patgh back--the endo bx proliferative secretory (interval phase) the cervical biopsy---single focus Atypical Glandular cells. Have discussed by phone and will send to [facility] GYn clinic cystobrush still pendiong  Denny Levy MD  September 26, 2010 2:52 PM   Complete Medication List: 1)  Ventolin Hfa 108 (90 Base) Mcg/act Aers (Albuterol sulfate) .... 2 - 4 puffs as needed for wheezing. 2)  Fluticasone Propionate 50 Mcg/act Susp (Fluticasone propionate) .... 2 sprays into each nostril daily for rhinitis, sinusitis 3)  Alprazolam 0.25 Mg Tabs (Alprazolam) .Marland Kitchen.. 1 tab by mouth q 8 hours as needed panic attack 4)  Valacyclovir Hcl 1 Gm Tabs (Valacyclovir hcl) .... Take 2 tabs by mouth x1 at the first sign of fever blister 5)  Chantix Starting Month Pak 0.5 Mg X 11 & 1 Mg X 42 Tabs (Varenicline tartrate) .... Take 0.5 mg daily for 3 days.  then 0.5 two times a day for 4 days.  then 1mg  daily; dispense 1 starter pack 6)  Chantix 1 Mg Tabs (Varenicline tartrate) .Marland Kitchen.. 1 tab by mouth daily after you finish starter pack 7)  Provera 10 Mg Tabs (Medroxyprogesterone acetate) .... Take one tab daily for 7 days. 8)  Naproxen 500 Mg Tabs (Naproxen) .... Take one tab twice daily as needed for cramping/pain.

## 2010-11-09 NOTE — Assessment & Plan Note (Signed)
Summary: colpo/tlb   Vital Signs:  Patient profile:   31 year old female Menstrual status:  irregular Weight:      122 pounds Temp:     98.6 degrees F oral Pulse rate:   92 / minute Pulse rhythm:   regular BP sitting:   121 / 88  (left arm)  Vitals Entered By: Loralee Pacas CMA (September 07, 2010 12:09 PM)  Allergies: 1)  Penicillin G Potassium (Penicillin G Potassium)   Impression & Recommendations: patient arrived but due to colposcope malfunction we had to reschedule her  Complete Medication List: 1)  Ventolin Hfa 108 (90 Base) Mcg/act Aers (Albuterol sulfate) .... 2 - 4 puffs as needed for wheezing. 2)  Fluticasone Propionate 50 Mcg/act Susp (Fluticasone propionate) .... 2 sprays into each nostril daily for rhinitis, sinusitis 3)  Alprazolam 0.25 Mg Tabs (Alprazolam) .Marland Kitchen.. 1 tab by mouth q 8 hours as needed panic attack 4)  Valacyclovir Hcl 1 Gm Tabs (Valacyclovir hcl) .... Take 2 tabs by mouth x1 at the first sign of fever blister 5)  Chantix Starting Month Pak 0.5 Mg X 11 & 1 Mg X 42 Tabs (Varenicline tartrate) .... Take 0.5 mg daily for 3 days.  then 0.5 two times a day for 4 days.  then 1mg  daily; dispense 1 starter pack 6)  Chantix 1 Mg Tabs (Varenicline tartrate) .Marland Kitchen.. 1 tab by mouth daily after you finish starter pack 7)  Provera 10 Mg Tabs (Medroxyprogesterone acetate) .... Take one tab daily for 7 days. 8)  Naproxen 500 Mg Tabs (Naproxen) .... Take one tab twice daily as needed for cramping/pain. U [facility] (81025) No Charge Patient Arrived (NCPA0) (NCPA0)   Orders Added: 1)  [facility] [81025] 2)  No Charge Patient Arrived (NCPA0) [NCPA0]     Complete Medication List: 1)  Ventolin Hfa 108 (90 Base) Mcg/act Aers (Albuterol sulfate) .... 2 - 4 puffs as needed for wheezing. 2)  Fluticasone Propionate 50 Mcg/act Susp (Fluticasone propionate) .... 2 sprays into each nostril daily for rhinitis, sinusitis 3)  Alprazolam 0.25 Mg Tabs (Alprazolam) .Marland Kitchen.. 1 tab by mouth q  8 hours as needed panic attack 4)  Valacyclovir Hcl 1 Gm Tabs (Valacyclovir hcl) .... Take 2 tabs by mouth x1 at the first sign of fever blister 5)  Chantix Starting Month Pak 0.5 Mg X 11 & 1 Mg X 42 Tabs (Varenicline tartrate) .... Take 0.5 mg daily for 3 days.  then 0.5 two times a day for 4 days.  then 1mg  daily; dispense 1 starter pack 6)  Chantix 1 Mg Tabs (Varenicline tartrate) .Marland Kitchen.. 1 tab by mouth daily after you finish starter pack 7)  Provera 10 Mg Tabs (Medroxyprogesterone acetate) .... Take one tab daily for 7 days. 8)  Naproxen 500 Mg Tabs (Naproxen) .... Take one tab twice daily as needed for cramping/pain.

## 2010-11-09 NOTE — Miscellaneous (Signed)
Summary: Procedures consent  Procedures consent   Imported By: De Nurse 10/03/2010 11:34:45  _____________________________________________________________________  External Attachment:    Type:   Image     Comment:   External Document

## 2010-11-13 ENCOUNTER — Telehealth: Payer: Self-pay | Admitting: *Deleted

## 2010-11-17 ENCOUNTER — Encounter (INDEPENDENT_AMBULATORY_CARE_PROVIDER_SITE_OTHER): Payer: Self-pay | Admitting: Obstetrics & Gynecology

## 2010-11-17 DIAGNOSIS — R87619 Unspecified abnormal cytological findings in specimens from cervix uteri: Secondary | ICD-10-CM

## 2010-11-17 DIAGNOSIS — Z9851 Tubal ligation status: Secondary | ICD-10-CM

## 2010-11-21 ENCOUNTER — Other Ambulatory Visit: Payer: Self-pay | Admitting: Obstetrics & Gynecology

## 2010-11-21 DIAGNOSIS — Z9851 Tubal ligation status: Secondary | ICD-10-CM

## 2010-11-23 NOTE — Progress Notes (Signed)
Summary: Phn Msg  Phone Note Call from Patient Call back at [phone]   Reason for Call: Talk to Nurse Summary of Call: [facility] biopsy, pt wants to know why shes getting another one when she just had a biopsy here.  Initial call taken by: Knox Royalty,  November 13, 2010 10:48 AM  Follow-up for Phone Call        spoke with pt and informed her that i will ask dr. Jennette Kettle about the bx that is being done at [facility] Follow-up by: Loralee Pacas CMA,  November 13, 2010 11:58 AM  Additional Follow-up for Phone Call Additional follow up Details #1::        DEAR WHITE TEAM she is going there for a TREATMENT BIOPSY--we did a DIAGNOSTIC biopsy. She will prpbably have a CONE biopsy or a LEEP procedure--these will be for TREATMENT. She NEEDS to go to this appointment Thanks!  Denny Levy MD  November 14, 2010 6:33 PM     Additional Follow-up for Phone Call Additional follow up Details #2::    pt informed and will keep appt at [facility] Follow-up by: Loralee Pacas CMA,  November 15, 2010 10:07 AM

## 2010-11-27 ENCOUNTER — Ambulatory Visit (HOSPITAL_COMMUNITY): Admission: RE | Admit: 2010-11-27 | Payer: 59 | Source: Ambulatory Visit

## 2010-12-11 ENCOUNTER — Ambulatory Visit (HOSPITAL_COMMUNITY)
Admission: RE | Admit: 2010-12-11 | Discharge: 2010-12-11 | Disposition: A | Discharge status: Home / Self Care | Payer: 59 | Source: Ambulatory Visit | Attending: Obstetrics & Gynecology | Admitting: Obstetrics & Gynecology

## 2010-12-11 DIAGNOSIS — Z9851 Tubal ligation status: Secondary | ICD-10-CM

## 2010-12-11 DIAGNOSIS — N949 Unspecified condition associated with female genital organs and menstrual cycle: Secondary | ICD-10-CM | POA: Insufficient documentation
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
## 2011-02-12 ENCOUNTER — Emergency Department (HOSPITAL_BASED_OUTPATIENT_CLINIC_OR_DEPARTMENT_OTHER)
Admission: EM | Admit: 2011-02-12 | Discharge: 2011-02-12 | Disposition: A | Discharge status: Home / Self Care | Payer: 59 | Attending: Emergency Medicine | Admitting: Emergency Medicine

## 2011-02-12 DIAGNOSIS — R6884 Jaw pain: Secondary | ICD-10-CM | POA: Insufficient documentation

## 2011-02-12 DIAGNOSIS — R51 Headache: Secondary | ICD-10-CM | POA: Insufficient documentation

## 2011-02-12 DIAGNOSIS — J45909 Unspecified asthma, uncomplicated: Secondary | ICD-10-CM | POA: Insufficient documentation

## 2011-02-12 DIAGNOSIS — F17200 Nicotine dependence, unspecified, uncomplicated: Secondary | ICD-10-CM | POA: Insufficient documentation

## 2011-02-20 NOTE — H&P (Signed)
[name], [name] NO.:  [id number]   MEDICAL RECORD NO.:  [id number]          PATIENT TYPE:  INP   LOCATION:  3714                         FACILITY:  [facility]   PHYSICIAN:  Deanna Artis. Hickling, M.D.DATE OF BIRTH:  [birth year]   DATE OF ADMISSION:  08/20/2008  DATE OF DISCHARGE:                              HISTORY & PHYSICAL   CHIEF COMPLAINT:  Left visual field change followed by left-sided  numbness and weakness.   HISTORY OF PRESENT ILLNESS:  Jenny Poole is a 30 year old woman who had  onset of changes in vision of her left eye that she noted around 11:15  a.m.  This occurred without headache.  She became nauseated.  She then  complained of numbness and weakness on the left side in arm and leg.  She was seen in the [facility] Emergency Room and code stroke  was called by Bruce Donath because of her left-sided weakness.  He  performed a CT scan of brain which I have reviewed and was normal.  She  was sent to [facility] as a code stroke.  Unfortunately, arrived at 1525  hours and I was not notified until I called to inquire about her  presence in the emergency room around 1600 hours.  By that time, she was  over 4 and half hours passed the onset of her symptoms and would not  qualify for TPA.   PAST MEDICAL HISTORY:  Remarkable for asthma and iron-deficiency anemia.   PAST SURGICAL HISTORY:  Tubal ligation, cesarean sections x3, one is a  surrogate.  She has two healthy children ages 64 and 38.  Risk factors for  stroke involve only tobacco use.   MEDICATIONS:  She has an inhaler and takes iron.   DRUG ALLERGIES:  None known.   FAMILY HISTORY:  Positive for cerebrovascular accident in maternal  grandfather.  Negative for illness in her parents and her siblings.   SOCIAL HISTORY:  The patient is followed by [facility].  She lives with her boyfriend and her children.  She works as a  Scientist, physiological.  She smoked a half-pack of  cigarettes per day since her  teen years and drinks occasional mixed drinks and wine but not on a  weekly basis.   REVIEW OF SYSTEMS:  Negative except for the medical conditions noted  above.  She seems depressed but denies history of depression.   PHYSICAL EXAMINATION:  VITAL SIGNS:  Temperature 98.8, blood pressure  128/70, resting pulse 81, respirations 16, and oxygen saturation 100%.  GENERAL:  This is a fatigued, red-haired, right-handed woman in no  distress.  HEAD, EYES, EARS, NOSE, AND THROAT:  No signs of infection.  No bruits.  NECK:  Supple.  Full range of motion.  LUNGS:  Clear.  HEART:  No murmurs.  Pulses normal.  ABDOMEN:  Soft.  Bowel sounds normal.  No hepatosplenomegaly.  EXTREMITIES:  Were well formed without edema, cyanosis, alterations in  tone or tight heel cords.  NEUROLOGIC:  The patient is awake and alert.  She made long pauses  between  responses.  She thought for a long time before saying that it  was November as part of her NIH stroke scale.  She has a very flat  affect and was speaking very softly.  Cranial Nerves:  Round and  reactive pupils.  She does not show an afferent pupillary defect beside  concerns raised about vision in her left eye.  She has some  inconsistency in her visual fields and is able to count to double  simultaneous stimuli clearly in the right eye and all four quadrants and  in left eye in the nasal hemifield and the temporal hemifield, she  counts all of stimuli in the inferior hemifield and intermittently in  the right superior hemifield.  Her funduscopic examination is entirely  normal.  Disc margins are sharp.  There is no signs of optic atrophy,  optic neuritis, papilledema, or abnormality in her macular region.   She has slight asymmetry in the nasal labial fold on the left but has  symmetric movement of her left face.  She is able to protrude her tongue  and elevate her uvula midline.  Air conduction greater than bone   conduction bilaterally.   Extraocular movements were full.  Conjugate motor examination normal  strength in her right side.  She did not drift with her left arm when  asked to keep in the air but have giveaway strength and overall her  strength is about 4/5 in the left arm and leg both proximally and  distally.  Sensation shows a mild left hemihypesthesia to pinprick but  not to cold.  She had good vibratory sensation bilaterally.  She had  good stereoagnosis bilaterally.  Cerebellar Examination:  Good finger-to-  nose and heel-knee-shin bilaterally.  Reflexes were symmetric and fairly  normal.  There is no clonus.  She had bilateral flexor plantar  responses.   IMPRESSION:  Left-sided weakness. (342.02) I strongly suspect that this  is functional but need to perform an MRI scan to evaluate for presence  of stroke.  Tobacco is her only risk factor.  I think that she gives the  distinct impression that she is depressed, even though she denied it.  She also denied any stressors in her life when asked about job, home,  and in general how she was feeling.  Numbness (782.0)  Subjective visual  changes (368.2)   She is not a candidate for TPA.  She was over four and half hours in  presentation at [facility] Emergency Room from the time of her symptoms.  In addition, given that I have concerns about whether or not the  symptoms are functional, I will be very reluctant to use TPA in this  woman.   I appreciate the opportunity to participate in her care.  We are  admitting her for observation and if the MRI scan is negative, I will  not carry out a detailed neurovascular workup in the patient.  We will  try to arrange for her to be seen in family practice clinic.  The  physician who is caring for her has graduated from the program, but I  believe that she still has a team and will work that through before the  discharge.      Deanna Artis. Sharene Skeans, M.D.  Electronically Signed      [facility]/MEDQ  D:  08/20/2008  T:  08/21/2008  Job:  [id number]

## 2011-02-20 NOTE — Discharge Summary (Signed)
NAMEWAVIE, HASHIMI NO.:  [id number]   MEDICAL RECORD NO.:  [id number]          PATIENT TYPE:  INP   LOCATION:  3714                         FACILITY:  [facility]   PHYSICIAN:  Deanna Artis. Hickling, M.D.DATE OF BIRTH:  1981/02/15   DATE OF ADMISSION:  08/20/2008  DATE OF DISCHARGE:  08/21/2008                               DISCHARGE SUMMARY   FINAL DIAGNOSES:  1. Visual disturbance, left eye 368.0.  2. Left-sided weakness that 342.02, left-sided numbness 782.0, no      evidence of stroke.  3. Tobacco abuse.  4. I suspect depression and anxiety as an underlying condition.   SUMMARY OF THE HOSPITALIZATION:  The patient is a 30 year old unmarried  mother of 2, who is working outside the home.  At work, she presented  with visual disturbance in the temporal field of her left eye and over  the time developed numbness and then weakness of left side of her body.   She was seen initially in the [facility] Emergency Room and  transferred to [facility].  Her only risk factors for stroke  were tobacco abuse.   The patient's examination appeared to be functional.  She had numbness  that did not split the midline, but that was inconsistent.  She had  visual field changes that were also inconsistent.  She had give away  strength on the left side.   Despite this, I felt the necessity to perform an MRI scan of the brain  (CT scan earlier today have been normal).  MRI of the brain and MRA  intracranial were both normal.   Her other laboratory findings were as follows; EKG showed regular sinus  rhythm with no abnormalities.  White blood cell count 8900, hemoglobin  13.2, hematocrit 38.9, MCV 94.2, and platelet count 187,000.  Early in  today differential 58, neutrophils 34, lymphs 5, 5 monocytes, 2  eosinophils,  2 basophils.  Prothrombin time 16.0, INR 1.2.  Activated  partial thromboplastin time 29, capillary glucose was 95.  Sodium 137,  potassium 3.8,  chloride 110, CO2 of 22, BUN 5, creatinine 0.63, total  bilirubin 0.7, alkaline phosphatase 38, AST 19, ALT 11, total protein  5.8, albumin 3.5, calcium 8.8, creatinine kinase 129, CK-MB 0.5,  troponin less than 0.01.  Cholesterol 124, triglyceride 80, HDL low at  22, LDL 86, and VLDL 16.  Serum homocystine and hemoglobin A1c are  pending at this time.  I thought that her urine pregnancy test had been  performed.  I do not see it in the pending results.  The patient has  tubal ligation.  I do not believe that she is pregnant at this time.   PHYSICAL EXAMINATION:  VITAL SIGNS:  Today, blood pressure 96/64,  resting pulse 76, respirations 15, and temperature 98.4.  GENERAL:  Physical examination is normal.  No cranial or cervical  bruits.  LUNGS:  Clear.  HEART:  No murmurs.  ABDOMEN:  Bowel sounds normal.  EXTREMITIES:  Well formed.  NEUROLOGIC:  Awake, alert, I think that she is still somewhat depressed.  She  has a flat affect and very soft voice.  Cranials nerves, round and reactive pupils.  Visual fields full to  double simultaneous stimuli including the left eye.  Symmetric facial  strength, midline tongue and uvula.  Motor examination, she has slight  giveaway on the left side but it is now 4+, 5- and with extra rotation  appears to be normal.  Her fine motor movements are normal.  The right  side is entirely normal.  Her gait is normal.  She can get up on her  toes and heels for forward and tandem without difficulty.  Romberg  responses negative.  Reflexes were symmetrically diminished.  The  patient had bilateral flexor plantar responses.   PLAN:  We are going to discharge this patient to home.  She is in  improved condition.  I discussed with her the power of the brain to  cause increased heart rate to flush a person's face when they are  embarrassed, and to cause cramping in the stomach when persons got  upset, as an illustration of how powerful it is and how it may cause   other symptoms that seem to cause both visual disturbance and weakness  found on her left side.  I discussed with her smoking cessation because  it is her only risk factor for stroke.  She is not ready to commit to  stop smoking.  I have asked her to contact [facility] to  re-establish contact with them not only to work on the smoking cessation  but also perhaps to discuss whether or not further evaluation for  depression and anxiety and or treatment is needed.  I appreciate the  opportunity to participate in her care and will be available to [facility] and if the patient has needed for further  neurologic complaints.      Deanna Artis. Sharene Skeans, M.D.  Electronically Signed     [facility]/MEDQ  D:  08/21/2008  T:  08/21/2008  Job:  [id number]   cc:   [facility]

## 2011-02-23 NOTE — Discharge Summary (Signed)
Jenny Poole, Jenny Poole                          ACCOUNT NO.:  [id number]   MEDICAL RECORD NO.:  [id number]                   PATIENT TYPE:  INP   LOCATION:  9106                                 [facility]ACILITY[facility]   PHYSICIAN:  Randye Lobo, M.D.                DATE OF BIRTH:  [birth year]   DATE OF ADMISSION:  10/01/2003  DATE OF DISCHARGE:  10/04/2003                                 DISCHARGE SUMMARY   FINAL DIAGNOSES:  1. Intrauterine pregnancy at 37-weeks gestation.  2. Spontaneous rupture of membranes.  3. History of previous Cesarean section.  4. History of previous vaginal birth after Cesarean.  5. Persistent variable decelerations and latent phase of labor.   PROCEDURE:  Repeat low transverse Cesarean section. Surgeon was Dr. Duane Lope. Complications none.   This 30 year old, G5, P2-0-2-2, presents at [redacted] weeks gestation with rupture  of membranes. The patient had a history of a previous vaginal birth after  Cesarean section and was allowed to labor with this current pregnancy. The  patient's antepartum course had been complicated by being a smoker. The  patient was counseled on this multiple times during her pregnancy, but  continued to smoke. The patient also had preterm contractions around the end  of her pregnancy and was on terbutaline and bedrest at some point. The  patient also was also RH negative and did get a RhoGAM shot at 28 weeks. She  has also had a history of anemia and she was started on some extra iron,  aside from her prenatal vitamins during her antepartum course. The patient  also is going to give the baby up for adoption through a private agency. At  this point the patient was admitted with rupture of membranes. During labor  she developed persistent variable decelerations and latency of labor.  Because of the decelerations in this late phase of labor, it was decided to  proceed with Cesarean section. The patient was taken to the operating room  on October 01, 2003, by Dr. Duane Lope where a repeat low transverse  Cesarean section was performed with delivery of a 5 pound 8 ounce female  infant with Apgars of 9 and 9.  The delivery went without complications.  There was a nuchal cord times one noted. The patient's postoperative course  was benign without significant fever. She had some postoperative anemia for  which she was started on iron for. The baby was still being placed for  adoption through an independent agency. The patient was thought ready for  discharge on postoperative day #3. She was sent home on a regular diet, told  to decrease activities, told to continue prenatal vitamins and FESO4 325 mg  one b.i.d. She was given a prescription for Percocet one to two q.4h. p.r.n.  pain.  She was told to use over-the-counter ibuprofen as needed and is to  follow up in the office  in four weeks.   LABORATORY ON DISCHARGE:  The patient had a hemoglobin of 7.4, white blood  cell count 15.3.     Leilani Able, P.A.-C.                Randye Lobo, M.D.    MB/MEDQ  D:  11/05/2003  T:  11/06/2003  Job:  [id number]

## 2011-02-23 NOTE — Op Note (Signed)
Jenny Poole, Jenny Poole NO.:  [id number]   MEDICAL RECORD NO.:  [id number]          PATIENT TYPE:  WOC   LOCATION:  WOC                          FACILITY:  [facility]   PHYSICIAN:  Lesly Dukes, M.D. DATE OF BIRTH:  1981-02-28   DATE OF PROCEDURE:  04/08/2005  DATE OF DISCHARGE:                                 OPERATIVE REPORT   PREOPERATIVE DIAGNOSES:  36.  A 30 year old gravida 7, para 3-0-3-3, at 38+ weeks estimated      gestational age in labor for repeat cesarean section.  2.  Multiparity, desires sterility.   POSTOPERATIVE DIAGNOSES:  21.  A 30 year old gravida 7, para 3-0-3-3, at 38+ weeks estimated      gestational age in labor for repeat cesarean section.  2.  Multiparity, desires sterility.   OPERATION/PROCEDURE:  Repeat low flap transverse cesarean section with  bilateral tubal ligation.   SURGEON:  Lesly Dukes, M.D.   ASSISTANT:  Kerby Nora, MD   ANESTHESIA:  Spinal.   SPECIMENS:  Placenta.   ESTIMATED BLOOD LOSS:  500 mL.   COMPLICATIONS:  None.   DESCRIPTION OF PROCEDURE:  After informed consent was obtained, the patient  was taken to the operating room where spinal anesthesia was found to be  adequate.  The patient was placed in the dorsal lithotomy position with a  leftward tilt and prepped and draped in the normal sterile fashion.  Foley  was inserted in the bladder.  Fetal heart rate was reassuring until the  patient was prepped and draped.   A Pfannenstiel skin incision was made with the scalpel and carried down to  the underlying layer of fascia.  The fascia was incised in the midline and  this incision was extended bilaterally with the Mayo scissors.  The superior  and inferior aspects of the fascial incision were grasped with Kocher  clamps, tented up, dissected off sharply and bluntly from underlying layers  of rectus muscles.  The rectus muscles were separated in the midline.  The  peritoneum was identified and entered  bluntly.  There was a small amount of  __________ adhesions that were easily taken down.  The peritoneal incision  was extended both superior and inferiorly with good visualization of the  bladder.  The bladder blade was inserted.  The bladder was densely adherent  to the anterior wall of the uterus.  Therefore, we went slightly higher with  the incision.  The uterine incision was made in transverse fashion and  extended bilaterally with the bandage scissors.  Amniotomy was performed  with an Allis.  Head was delivered atraumatically and nose and mouth were  suctioned.  The rest of the baby's body delivered easily.  The cord was  clamped and cut and the baby was handed off to the waiting pediatrician.  Cord blood was sent for type and screen and an arterial cord blood gas was  also sent from the umbilical artery.  This was 7.32.  Placenta delivered  spontaneously with three-vessel cord.  The uterus was cleared of all clots  and debris.   The  uterine incision was closed with 0 Vicryl in a running locked fashion. A  suture of 0 Monocryl was used to reinforce the incision and aid in  hemostasis.   Before any narcotic was given to the patient intravenously, I confirmed that  the patient wanted a tubal ligation.  She understood that this procedure was  meant to be permanent and never meant to be reversed.  Although she was  giving this baby up for adoption, she still wanted no more children ever in  her life.  She also does not have Medicaid and understands that she will get  a bill for this procedure.  After all this was confirmed, each fallopian  tube was followed out to its fimbriated end and a Filshie clip was placed  across the entire segment of the fallopian tube.   Uterus was returned to the abdomen and the uterus was noted to be hemostatic  on tension.  The omentum, peritoneum and rectus muscles were hemostatic.  The fascia was closed with 0 Vicryl in a running  fashion.  The  subcutaneous  tissue was copiously irrigated and noted to be hemostatic.  The skin was  closed with staples and a pressure dressing was placed on the abdomen.  The  patient tolerated the procedure well.  Sponge, lab, instrument, and needle  counts were x2.  The patient went to the recovery room in stable condition.       KHL/MEDQ  D:  04/08/2005  T:  04/08/2005  Job:  [id number]

## 2011-02-23 NOTE — Discharge Summary (Signed)
Jenny Poole, SCHNEEBERGER NO.:  [id number]   MEDICAL RECORD NO.:  [id number]          PATIENT TYPE:  INP   LOCATION:  9126                          [facility]   PHYSICIAN:  Lesly Dukes, M.D. DATE OF BIRTH:  28-Sep-1981   DATE OF ADMISSION:  04/08/2005  DATE OF DISCHARGE:  04/11/2005                                 DISCHARGE SUMMARY   ADMISSION DIAGNOSIS:  Intrauterine pregnancy at 38-4/7 weeks.   DISCHARGE DIAGNOSIS:  Repeat low transverse cesarean section status post  intrauterine pregnancy.   PROCEDURE:  Repeat low transverse cesarean section and bilateral tubal  ligation performed on  April 08, 2005 by Dr. Elsie Lincoln.   FINAL DIAGNOSES:  1.  Intrauterine pregnancy at 38-4/[redacted] weeks gestation.  2.  History of previous cesarean section.  3.  History of previous vaginal birth after cesarean section.  4.  Active labor at 38-4/7 weeks on admission.   HOSPITAL COURSE:  The patient is a 30 year old G7, P3, 0, 3, 3, presenting  at 37-4/7 weeks to the MAU with contractions over 1 to 2 minutes. The  patient was scheduled for a repeat cesarean section on April 11, 2005. The  patient had a history of previous vaginal birth after cesarean section and  had also had a previous vaginal delivery after cesarean section. However,  with this pregnancy she had scheduled a repeat cesarean section. Due to  cervical exam noted to be 2, 60 and 0, and patient in active labor, it was  decided to go ahead and perform repeat cesarean section. Informed consent  was obtained and the patient was taken to the operating room where spinal  anesthesia was found to be adequate. The patient had a low transverse  cesarean section performed by Dr. Elsie Lincoln and first assistant Dr. Kerby Nora. A female infant was delivered with Apgar's of 8 and 9. Cord blood was  sent for type and screen for arterial blood gases, also sent from the  umbilical artery which was 7.32. Placenta delivered  spontaneously with a 3-  vessel cord. The uterus was cleared of all clots. The uterus was closed with  3.0 Vicryl and sutured with Monocryl to reinforce the incision and  hemostasis. Before any narcotic was given to the patient intravenously, Dr.  Penne Lash confirmed that the patient wanted a tubal ligation. The patient  understood that this procedure was meant to be permanent and never meant to  be reversed. The patient continued to desire this, and this baby is going to  be given up for private adoption. She wanted no more children in her life.  The patient does not have Medicaid and understands that she will get a bill  for this procedure. After this was confirmed, each fallopian tube was  followed and bilateral tubal ligation was performed by Dr. Elsie Lincoln.  The patient tolerated the procedure well and was then taken to the  mother/baby unit. The patient had round postoperative care and tolerated  this well.   It was noted prior to the cesarean section that the patient had a low  hemoglobin of 7.7. Upon completion of the C-section and bilateral tubal  ligation her hemoglobin was noted to be 6.2.  On postoperative day number 1  hemoglobin was noted to be 7.2. The patient tolerated this and was  asymptomatic. She normally takes iron pills at home.   It is also noted the patient is going to give the baby up for adoption and  the social worker was consulted. See above per private adoption. The baby  will go home with mother on discharge from the hospital. It is also noted  that the adoptive parents will be here when the baby and mother leave as  well. The patient states that she is aware of the situation and is fine with  private adoption and will have all of her paper work together upon leaving.   The patient was thought to be ready for discharge on postoperative day  number three.  She is sent home on a regular diet, decreased activity.  Continue prenatal vitamins which she states  she has at home, and iron  sulfate 325 mg one b.i.d. The patient was also given a prescription for  Percocet 5/325 mg 1-2 q.4h p.r.n. pain, and was given a prescription for  Ibuprofen 600 mg q.4h p.r.n. pain. The patient was told to use over-the-  counter stool softeners as needed and understood.   The patient is to follow up in GYN clinic in 4 weeks.   LABS ON DISCHARGE:  White blood cell count 14,700, red blood cell count of  3.4, hemoglobin 7.2, hematocrit of 23.0, platelet count of 186,000. The  patient is Rubella immune. Blood type B negative  and antibody negative.   The patient has contraception of bilateral tubal ligation.       MB/MEDQ  D:  04/11/2005  T:  04/11/2005  Job:  [id number]

## 2011-02-23 NOTE — Group Therapy Note (Signed)
NAMESHELLEY, COCKE NO.:  [id number]   MEDICAL RECORD NO.:  [id number]          PATIENT TYPE:  WOC   LOCATION:  [facility]                   FACILITY:  [facility]   PHYSICIAN:  Tinnie Gens, MD        DATE OF BIRTH:  Dec 15, 1980   DATE OF SERVICE:  05/10/2005                                    CLINIC NOTE   CHIEF COMPLAINT:  Postpartum check.   HISTORY OF PRESENT ILLNESS:  The patient is a 30 year old gravida 7 para 4-0-  3-4 who is 4 weeks postpartum from a repeat C-section who gave the baby up  for adoption. She also had a tubal ligation at that time. The patient  reports no symptoms of depression or regret.   The patient complained of some foul-smelling vaginal odor and some vaginal  tenderness. She has no significant vaginal bleeding. She denied nausea,  vomiting, or fever.   PHYSICAL EXAMINATION TODAY:  VITAL SIGNS:  As noted in the chart.  GENERAL:  She is a well-developed, well-nourished, white female in no acute  distress.  ABDOMEN:  Her C-section incision is well healed.  GENITOURINARY:  She has normal external female genitalia. The vagina is pink  and rugated. There is a green discharge noted. The cervix is parous and  without lesion. The uterus is tender. There is some mild cervical motion  tenderness. Her adnexa are without mass or tenderness.   IMPRESSION:  1.  Postpartum check.  2.  Status post bilateral tubal ligation.  3.  Question late endometritis.   PLAN:  1.  Doxycycline 100 mg p.o. b.i.d. for 14 days.  2.  Pap smear today.  3.  Check GC, chlamydia, and wet prep.       TP/MEDQ  D:  05/10/2005  T:  05/10/2005  Job:  [id number]

## 2011-02-23 NOTE — Op Note (Signed)
Jenny Poole, Jenny Poole                          ACCOUNT NO.:  [id number]   MEDICAL RECORD NO.:  [id number]                   PATIENT TYPE:  INP   LOCATION:  9106                                 [facility]   PHYSICIAN:  Miguel Aschoff, M.D.                    DATE OF BIRTH:  1981-06-07   DATE OF PROCEDURE:  10/01/2003  DATE OF DISCHARGE:                                 OPERATIVE REPORT   PREOPERATIVE DIAGNOSES:  1. Intrauterine pregnancy at 37 weeks.  2. Spontaneous rupture of membranes.  3. Previous cesarean section.  4. Previous VBAC.  5. Persistent variable decelerations and latent phase labor.   POSTOPERATIVE DIAGNOSES:  1. Intrauterine pregnancy at 37 weeks.  2. Spontaneous rupture of membranes.  3. Previous cesarean section.  4. Previous VBAC.  5. Persistent variable decelerations and latent phase labor.  6. Delivery of viable female infant, Apgars 9 and 9, nuchal cord x 1.   PROCEDURE:  Repeat low flap transverse cesarean section.   SURGEON:  Miguel Aschoff, M.D.   ANESTHESIA:  Spinal.   COMPLICATIONS:  None.   JUSTIFICATION:  The patient is a 30 year old white female, gravida 5, para 2-  0-2-2, with an estimated date of confinement of October 19, 2003.  The  patient had spontaneous rupture of membranes 10/01/2003, was admitted.  Because of previous vaginal birth after cesarean section, was allowed to  labor but developed persistent variable decelerations and latency of labor.  Because of the decelerations with the patient in the late phase, it was  elected to proceed at this point with cesarean section.   DESCRIPTION OF PROCEDURE:  The patient was taken to the operating room,  placed in a sitting position, and spinal anesthesia was administered without  difficulty.  She was then placed in a supine position, deviated to the left,  and then prepped and draped in the usual sterile fashion.  A Foley catheter  was inserted.  Once this was done, her previous Pfannenstiel  incision was  incised, extended down to the subcutaneous tissue with bleeding points being  clamped and coagulated as they were encountered.  The fascia was then  identified, incised transversely, and separated from the underlying rectus  muscles.  The rectus muscles were divided in the midline.  Peritoneum was  then found and entered, carefully avoiding the underlying structures.  The  peritoneal incision was then extended under direct visualization.  A bladder  flap was created and protected with the bladder blade.  At this point, a  left lower transverse incision was made into the lower uterine segment.  The  amniotic cavity was entered.  Clear fluid was obtained and, at this point,  the patient was delivered of a viable female infant, Apgars 9 at one minute  and 9 at 5 minutes, vertex LOA position.  Nuchal cord x 1 was noted.  It was  felt to  be responsible for the persistent variable decelerations.  Baby was  handed to the pediatric team in attendance.  Cord bloods were obtained for  appropriate studies.  Cord pH was obtained and was 7.34.  Placenta was  delivered.  Then the uterus was evacuated of any remaining products of  conception.  The angles of the uterine incision were then ligated using  figure-of-eight sutures of #1 Vicryl.  The uterus was then closed in layers.  The first layer was a running interlocking suture of #1 Vicryl followed by  an imbricating suture of #1 Vicryl.  Bladder flap was reapproximated using  running continuous 2-0 Vicryl suture.  At this point, tubes and ovaries were  inspected, were noted to be normal.  And, at this point, the abdomen was  closed.  __________ was closed using running continuous 0 Vicryl suture.  Rectus muscles were reapproximated using running continuous 0 Vicryl suture.  Fascia was closed using two sutures of 0 Vicryl, each stopping at the  lateral fascial angles, then meeting in the midline.  Subcutaneous tissue  and skin were closed  using staples.  The estimated blood loss was  approximately 600 mL.  The patient tolerated the procedure well and went to  the recovery room in satisfactory condition.                                               Miguel Aschoff, M.D.    AR/MEDQ  D:  10/01/2003  T:  10/01/2003  Job:  [id number]

## 2011-02-23 NOTE — Op Note (Signed)
[facility]  Patient:    Jenny Poole, Jenny Poole                       MRN: [id number] Proc. Date: 01/17/01 Adm. Date:  [id number] Disc. Date: [id number] Attending:  Antionette Char                           Operative Report  PREOPERATIVE DIAGNOSES:       1. A 40+ week intrauterine pregnancy.                               2. Intrauterine growth restriction, with an                                  estimated fetal weight less than the 10th                                  percentile.                               3. Pyelonephritis.                               4. Fetal heart tracing with severe variable                                  decelerations and late decelerations.                               5. Prodromal labor.  POSTOPERATIVE DIAGNOSES:      1. A 40+ week intrauterine pregnancy.                               2. Intrauterine growth restriction, with an                                  estimated fetal weight less than the 10th                                  percentile.                               3. Pyelonephritis.                               4. Fetal heart tracing with severe variable                                  decelerations and late decelerations.  5. Prodromal labor.  PROCEDURE:                    Primary low transverse cesarean section via Pfannenstiel.  SURGEON:                      Roseanna Rainbow, M.D.  ANESTHESIA:                   Spinal.  COMPLICATIONS:                None.  ESTIMATED BLOOD LOSS:         800 cc.  FLUIDS:                       1500 cc lactated Ringers.  URINE OUTPUT:                 300 cc clear urine.  INDICATIONS:                  A 30 year old G1, P0 at 40+ weeks who was admitted for induction secondary to intrauterine growth restrictions. However, she developed pyelonephritis.  Spontaneous late decelerations and severe variable decelerations with a cervical  exam of finger tip, 50% and mid position.  FINDINGS:                     The infant was in the cephalic presentation. Neonatology was present at delivery.  Apgars of 8 and 9.  Umbilical artery pH 7.25.  Normal uterus, tubes and ovaries.  DESCRIPTION OF PROCEDURE:     The patient was taken to the operating room, where a spinal anesthetic was administered and found to be adequate.  She was prepped and draped in the usual sterile fashion in the dorsal supine position with a leftward tilt.  A Pfannenstiel skin incision was then made with a scalpel and carried through to the underlying layer of fascia.  The fascia was then incised in the midline and the incision extended laterally with Mayo scissors.  The superior aspect of the fascial incision was then grasped with Kocher clamps, elevated and the underlying rectus muscles dissected off. Attention was then turned to the inferior aspect of this incision, which was manipulated in a similar fashion.  The rectus muscles were separated in the midline and the parietal peritoneum was identified, tented up and entered sharply.  The peritoneal incision was then extended superiorly and inferiorly with good visualization of the bladder.  A bladder blade was then inserted and the vesicouterine peritoneum identified, grasped with pickups and entered sharply with Metzenbaum scissors.  This incision was then extended laterally and a bladder flap created digitally.  The bladder blade was then reinserted and the lower uterine segment incised in a transverse fashion with the scalpel.  The uterine incision was then extended laterally with bandage scissors.  The bladder blade was removed and the infants head delivered atraumatically.  The nose and mouth were suctioned with bulb suction.  The cord was clamped and cut.  The infant was handed off to the awaiting neonatologist.  An umbilical artery has was sent.  The placenta was then removed.  The uterus was  exteriorized and cleared of all clots and debris. The uterine incision was then repaired with 0 Monocryl in running lock fashion.  Excellent hemostasis was noted.  The gutters were cleared of all clots.  The fascia was reapproximated with 0 Vicryl in a running  fashion.  The skin was closed with staples.  The patient tolerated the procedure well. Sponge, lap, needle and instrument counts were correct x 2.  The patient was taken to the PACU in stable condition. DD:  01/20/01 TD:  01/20/01 Job: [id number]

## 2011-02-23 NOTE — H&P (Signed)
[facility]  Patient:    Jenny Poole, Jenny Poole Visit Number: [id number] MRN: [id number]          Service Type: ANT Location: [facility] Attending Physician:  Wandalee Ferdinand Dictated by:   Rudy Jew Ashley Royalty, M.D. Admit Date:  11/06/2001                           History and Physical  HISTORY:                      This is a 30 year old gravida 3, para 1, AB1 EDC December 14, 2001 placing her currently at approximately 63 1/[redacted] weeks gestation. Prenatal care has been complicated by previous cesarean section (low transverse), late presentation for care, Rh negative, asthma, condylomata (perianal), and probable intrauterine growth restriction.  Patient was first seen for prenatal care at approximately [redacted] weeks gestation.  She did state she had an ultrasound at [facility] at approximately [redacted] weeks gestation which was consistent with the aforementioned EDC.  She had a colposcopy during the pregnancy which also revealed CIN II.  Ultrasound October 30, 2001 revealed a compositive 32 weeks 5 days gestation where as the menstrual dates were 33 weeks 4 days gestation.  The ultrasound was repeated on or about November 20, 2001.  At that time the composite was 35 weeks 2 days gestation where as the menstrual dates were 36 weeks 5 days gestation.  Of interest was the fact that the abdominal circumference was only 34 weeks 5 days gestation.  Patient was recently noted to have a cervix favorable for delivery and is thus brought in for induction secondary to probable intrauterine growth restriction. Amniocentesis was performed December 03, 2001 at [facility] for lung maturity.  The LS ratio was noted to be 5.8 with PG present at [facility].  MEDICATIONS:                  Vitamins.  PAST MEDICAL HISTORY:         Asthma, Rh negative.  Patient received RhoGAM at or about 28 weeks of gestation.  PAST SURGICAL HISTORY:        Previous cesarean section (low  transverse).  ALLERGIES:                    PENICILLIN.  FAMILY HISTORY:               Noncontributory.  SOCIAL HISTORY:               Patient denies use of tobacco or significant alcohol.  REVIEW OF SYSTEMS:            Noncontributory.  PHYSICAL EXAMINATION  GENERAL:                      Well-developed, well-nourished, pleasant white female in no acute distress.  VITAL SIGNS:                  Afebrile.  Vital signs stable.  SKIN:                         Warm and dry without lesions.  LYMPH:                        There is no supraclavicular, cervical, or inguinal adenopathy.  HEENT:  Normocephalic.  NECK:                         Supple without thyromegaly.  CHEST:                        Lungs are clear.  CARDIAC:                      Regular rate and rhythm without murmur, rub, or gallop.  BREASTS:                      Deferred.  ABDOMEN:                      Gravid with a fundal height of approximately 36.  MUSCULOSKELETAL:              Full range of motion without edema, cyanosis, or CVA tenderness.  PELVIC:                       Deferred.  IMPRESSION:                   1. Intrauterine pregnancy at 29 1/[redacted] weeks                                  gestation.                               2. Previous cesarean section (low transverse).                               3. Probable intrauterine growth restriction.                               4. Rh negative.                               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                               6. Asthma.                               7. Condylomata acuminata (perianal).                               8. Mature lung profile.  PLAN:                         1. Admit.                               2. Induction of labor.  Risks, benefits,                                  complications, and alternatives were fully  discussed with the patient.  Possible  uterine                                  dehiscence and its ramifications discussed                                  and accepted.  Questions invited and                                  answered. Dictated by:   Rudy Jew Ashley Royalty, M.D. Attending Physician:  Wandalee Ferdinand DD:  12/03/01 TD:  12/03/01 Job: [id number]

## 2011-02-23 NOTE — Discharge Summary (Signed)
[facility]  Patient:    Jenny Poole, Jenny Poole                       MRN: [id number] Adm. Date:  [id number] Disc. Date: [id number] Attending:  Antionette Char Dictator:   Maryelizabeth Rowan, M.D.                           Discharge Summary  PRIMARY DIAGNOSIS:            Intrauterine pregnancy at 40-2/7 weeks, status post cesarean section.  HOSPITAL COURSE:              This 30 year old, G2, P0-0-1-0, presented at 40-2/7 weeks with uterine contractions and moderate pain every 8 to 12 minutes. She was GBS positive. She was admitted for observation.  Induction of labor was attempted with Cervidil which was essentially ineffective. Further evaluation suggested IUGR of the fetus and the patient did have good fetal heart tones but late decelerations during contractions. Therefore, the patient was brought to the OR for a low transverse cesarean section.  PROBLEMS: #1 - PYELONEPHRITIS:  The patient was treated with Ancef IV and this is a clinical diagnosis per hospital records.  #2 - GBS POSITIVE:  Patient received clindamycin during labor.  The patient was discharged home in stable condition on postoperative day #3 after low transverse cesarean section. She was stable, breast-feeding well.  DISCHARGE FOLLOWUP:           She will follow up at [facility] in six weeks.  DISCHARGE MEDICATIONS:        Prenatal vitamins and iron and Percocet p.r.n. for pain. DD:  04/11/01 TD:  04/11/01 Job: [id number]

## 2011-05-01 ENCOUNTER — Other Ambulatory Visit: Payer: Self-pay | Admitting: *Deleted

## 2011-05-01 ENCOUNTER — Telehealth: Payer: Self-pay | Admitting: Family Medicine

## 2011-05-01 MED ORDER — ALBUTEROL SULFATE HFA 108 (90 BASE) MCG/ACT IN AERS: INHALATION_SPRAY | RESPIRATORY_TRACT | Status: DC

## 2011-05-01 NOTE — Telephone Encounter (Signed)
Called to pharmacy as requested by patient in [place].

## 2011-05-01 NOTE — Telephone Encounter (Signed)
It is noted that patient has not recieved albuterol inhaler form this office since 12/09. She was in for a CPE   In December 2011 and it is noted on med list.  Consulted with Dr. Sheffield Slider and he advises may be  called in and patient needs appointment for follow up. Spoke with patient and she states she used an inhaler she brought with her and it wasn't helping and she realized it had expired last March. Advised patient that we will call in Rx now but if she is having difficulty with breathing she needs to go to an Urgent Care for evaluation. States  when she has been sick she has gone to an Urgent Care in [facility] and that is where she has been getting her refills.

## 2011-05-01 NOTE — Telephone Encounter (Signed)
Is in [place] visiting and she forgot her Albuterol inhaler - wants to know if we can call one into pharm out there, she is needing it asap. [place], [place] [facility]- [phone]

## 2011-07-05 LAB — CBC
Hemoglobin: 14.7
MCHC: 34.1
RBC: 4.58
WBC: 10.6 — ABNORMAL HIGH

## 2011-07-05 LAB — URINALYSIS, ROUTINE W REFLEX MICROSCOPIC
Bilirubin Urine: NEGATIVE
Glucose, UA: NEGATIVE
Hgb urine dipstick: NEGATIVE
Ketones, ur: NEGATIVE
Leukocytes, UA: NEGATIVE
Nitrite: POSITIVE — AB
Protein, ur: NEGATIVE
Specific Gravity, Urine: 1.016
Urobilinogen, UA: 1
pH: 6.5

## 2011-07-05 LAB — URINE MICROSCOPIC-ADD ON

## 2011-07-05 LAB — WET PREP, GENITAL
Trich, Wet Prep: NONE SEEN
Yeast Wet Prep HPF POC: NONE SEEN

## 2011-07-05 LAB — COMPREHENSIVE METABOLIC PANEL
ALT: 12
Alkaline Phosphatase: 50
CO2: 26
Chloride: 106
GFR calc non Af Amer: >60
Glucose, Bld: 94
Potassium: 3.7
Sodium: 137
Total Bilirubin: 0.8

## 2011-07-05 LAB — DIFFERENTIAL
Basophils Absolute: 0.1
Basophils Relative: 1
Eosinophils Relative: 3
Monocytes Absolute: 0.5
Neutro Abs: 6

## 2011-07-05 LAB — POCT PREGNANCY, URINE
Operator id: 261601
Preg Test, Ur: NEGATIVE

## 2011-07-05 LAB — GC/CHLAMYDIA PROBE AMP, GENITAL
Chlamydia, DNA Probe: NEGATIVE
GC Probe Amp, Genital: NEGATIVE

## 2011-07-10 LAB — DIFFERENTIAL
Basophils Absolute: 0.1
Eosinophils Absolute: 0.2
Eosinophils Relative: 2
Lymphocytes Relative: 34
Lymphs Abs: 2.7
Neutrophils Relative %: 58

## 2011-07-10 LAB — CARDIAC PANEL(CRET KIN+CKTOT+MB+TROPI)
Relative Index: 0.4
Total CK: 129

## 2011-07-10 LAB — CBC
MCV: 94.2
Platelets: 172
RBC: 4.13
RDW: 11.8
WBC: 8.9
WBC: 8

## 2011-07-10 LAB — BASIC METABOLIC PANEL
BUN: 9
Calcium: 8.8
Creatinine, Ser: 0.7
GFR calc non Af Amer: >60
Glucose, Bld: 93

## 2011-07-10 LAB — COMPREHENSIVE METABOLIC PANEL
ALT: 11
AST: 19
Alkaline Phosphatase: 38 — ABNORMAL LOW
CO2: 22
Chloride: 110
Creatinine, Ser: 0.63
GFR calc Af Amer: >60
GFR calc non Af Amer: >60
Potassium: 3.8
Total Bilirubin: 0.7

## 2011-07-10 LAB — PROTIME-INR: Prothrombin Time: 16 — ABNORMAL HIGH

## 2011-07-10 LAB — HEPATIC FUNCTION PANEL
Albumin: 3.7
Alkaline Phosphatase: 41
Total Bilirubin: 0.6

## 2011-07-10 LAB — GLUCOSE, CAPILLARY: Glucose-Capillary: 121 — ABNORMAL HIGH

## 2011-07-10 LAB — LIPID PANEL
HDL: 22 — ABNORMAL LOW
LDL Cholesterol: 86
Total CHOL/HDL Ratio: 5.6
Triglycerides: 80
VLDL: 16

## 2011-07-10 LAB — HEMOGLOBIN A1C: Mean Plasma Glucose: 100

## 2014-03-20 ENCOUNTER — Emergency Department (HOSPITAL_BASED_OUTPATIENT_CLINIC_OR_DEPARTMENT_OTHER): Payer: 59

## 2014-03-20 ENCOUNTER — Emergency Department (HOSPITAL_BASED_OUTPATIENT_CLINIC_OR_DEPARTMENT_OTHER)
Admission: EM | Admit: 2014-03-20 | Discharge: 2014-03-20 | Disposition: A | Discharge status: Home / Self Care | Payer: 59 | Attending: Emergency Medicine | Admitting: Emergency Medicine

## 2014-03-20 ENCOUNTER — Encounter (HOSPITAL_BASED_OUTPATIENT_CLINIC_OR_DEPARTMENT_OTHER): Payer: Self-pay | Admitting: Emergency Medicine

## 2014-03-20 DIAGNOSIS — M79609 Pain in unspecified limb: Secondary | ICD-10-CM | POA: Insufficient documentation

## 2014-03-20 DIAGNOSIS — Z79899 Other long term (current) drug therapy: Secondary | ICD-10-CM | POA: Insufficient documentation

## 2014-03-20 DIAGNOSIS — F17200 Nicotine dependence, unspecified, uncomplicated: Secondary | ICD-10-CM | POA: Insufficient documentation

## 2014-03-20 DIAGNOSIS — IMO0002 Reserved for concepts with insufficient information to code with codable children: Secondary | ICD-10-CM | POA: Insufficient documentation

## 2014-03-20 DIAGNOSIS — Z880 Allergy status to penicillin: Secondary | ICD-10-CM | POA: Insufficient documentation

## 2014-03-20 DIAGNOSIS — M79645 Pain in left finger(s): Secondary | ICD-10-CM

## 2014-03-20 DIAGNOSIS — Z791 Long term (current) use of non-steroidal anti-inflammatories (NSAID): Secondary | ICD-10-CM | POA: Insufficient documentation

## 2014-03-20 DIAGNOSIS — J45909 Unspecified asthma, uncomplicated: Secondary | ICD-10-CM | POA: Insufficient documentation

## 2014-03-20 HISTORY — DX: Unspecified asthma, uncomplicated: J45.909

## 2014-03-20 MED ORDER — IBUPROFEN 800 MG PO TABS
800.0000 mg | ORAL_TABLET | Freq: Once | ORAL | Status: AC
  Administered 2014-03-20: 800 mg via ORAL
  Filled 2014-03-20: qty 1

## 2014-03-20 MED ORDER — IBUPROFEN 600 MG PO TABS: 600.0000 mg | ORAL_TABLET | Freq: Three times a day (TID) | ORAL | Status: DC | PRN

## 2014-03-20 MED ORDER — HYDROCODONE-ACETAMINOPHEN 5-325 MG PO TABS: 1.0000 | ORAL_TABLET | ORAL | Status: DC | PRN

## 2014-03-20 NOTE — ED Notes (Signed)
Patient has had hand pain for the past 2 days. Along thumb joint, swelling noted

## 2014-03-20 NOTE — ED Provider Notes (Signed)
CSN: [id number]     Arrival date & time 03/20/14  [id number] History   First MD Initiated Contact with Patient 03/20/14 [phone]     Chief Complaint  Patient presents with  . Hand Pain      HPI Patient reports increasing left thumb and left first finger pain over the past 2 days.  No fevers or chills.  She does no repetitive movements with her left hand.  She reports she mainly since tax messages with her right thumb.  She does type at work.  She denies wrist or arm pain.  Her pain is mild to moderate in severity.  Her pain is worse with palpation and movement of her left thumb.  No rash or redness noted by the patient.  No known trauma   Past Medical History  Diagnosis Date  . Asthma    Past Surgical History  Procedure Laterality Date  . Cesarean section    . Lasik    . Cervical biopsy  w/ loop electrode excision     No family history on file. History  Substance Use Topics  . Smoking status: Current Every Day Smoker  . Smokeless tobacco: Not on file  . Alcohol Use: No   OB History   Grav Para Term Preterm Abortions TAB SAB Ect Mult Living                 Review of Systems  All other systems reviewed and are negative.     Allergies  Penicillins  Home Medications   Prior to Admission medications   Medication Sig Start Date End Date Taking? Authorizing Provider  albuterol (VENTOLIN HFA) 108 (90 BASE) MCG/ACT inhaler 2-puffs every 4 hours  as needed for wheezing  05/01/11   Zachery DauerWayne Hale, MD  ALPRAZolam Prudy Feeler(XANAX) 0.25 MG tablet Take 0.25 mg by mouth every 8 (eight) hours as needed. For panic attack     Historical Provider, MD  fluticasone (FLONASE) 50 MCG/ACT nasal spray 2 sprays by Nasal route daily. For rhinitis, sinusitis     Historical Provider, MD  HYDROcodone-acetaminophen (NORCO/VICODIN) 5-325 MG per tablet Take 1 tablet by mouth every 4 (four) hours as needed for moderate pain. 03/20/14   Lyanne CoKevin M Krishon [name], MD  ibuprofen (ADVIL,MOTRIN) 600 MG tablet Take 1 tablet (600 mg total)  by mouth every 8 (eight) hours as needed. 03/20/14   Lyanne CoKevin M Pricilla Moehle, MD  medroxyPROGESTERone (PROVERA) 10 MG tablet Take 10 mg by mouth daily. Take one tab daily for 10 days      Historical Provider, MD  naproxen (NAPROSYN) 500 MG tablet Take 500 mg by mouth 2 (two) times daily as needed. For cramping/pain     Historical Provider, MD  valACYclovir (VALTREX) 1000 MG tablet Take 1,000 mg by mouth. Take 2 tabs by mouth x 1 at the first sign of fever blister     Historical Provider, MD  varenicline (CHANTIX STARTING MONTH PAK) 0.5 MG X 11 & 1 MG X 42 tablet Take 0.5 mg by mouth 2 (two) times daily. Take one 0.5mg  tablet by mouth once daily for 3 days, then increase to one 0.5mg  tablet twice daily for 4 days, then increase to one 1mg  tablet daily. Disp 1 starter pack     Historical Provider, MD  varenicline (CHANTIX) 1 MG tablet Take 1 mg by mouth daily. 1 tab by mouth daily after you finish starter pack     Historical Provider, MD   BP 121/84  Pulse 95  Temp(Src) 98.1 F (  36.7 C) (Oral)  Resp 20  Ht 5\' 3"  (1.6 m)  Wt 138 lb (62.596 kg)  BMI 24.45 kg/m2  SpO2 98%  LMP 03/20/2014 Physical Exam  Nursing note and vitals reviewed. Constitutional: She is oriented to person, place, and time. She appears well-developed and well-nourished.  HENT:  Head: Normocephalic.  Eyes: EOM are normal.  Neck: Normal range of motion.  Pulmonary/Chest: Effort normal.  Abdominal: She exhibits no distension.  Musculoskeletal: Normal range of motion.  Patient with mild tenderness of her first and second metacarpal on her left hand.  She also has pain with range of motion of her MTP joint of her left thumb.  She has mild tenderness of the proximal phalanx of her left thumb.  There is no overlying erythema, warmth, significant swelling of the left thumb or left thenar eminence  Neurological: She is alert and oriented to person, place, and time.  Psychiatric: She has a normal mood and affect.    ED Course   Procedures (including critical care time) Labs Review Labs Reviewed - No data to display  Imaging Review Dg Hand Complete Left  03/20/2014   CLINICAL DATA:  Hand pain since 1 day.  No injury.  EXAM: LEFT HAND - COMPLETE 3+ VIEW  COMPARISON:  None.  FINDINGS: There is no evidence of fracture or dislocation. There is no evidence of arthropathy or other focal bone abnormality. Soft tissues are unremarkable.  IMPRESSION: Negative.   Electronically Signed   By: Tiburcio PeaJonathan  Watts M.D.   On: 03/20/2014 11:18  I personally reviewed the imaging tests through PACS system I reviewed available ER/hospitalization records through the EMR    EKG Interpretation None      MDM   Final diagnoses:  Pain of left thumb    I suspect this is some degree of tendinitis.  Patient be placed in a thumb spica for conservative measures and will provide symptomatic treatment until she can followup with sports medicine.  No signs of infection at this time.   Medical Screening Examination completed and feel no Emergency Medical Condition present.     Lyanne CoKevin M Chesney Klimaszewski, MD 03/20/14 1128

## 2014-03-23 ENCOUNTER — Ambulatory Visit (INDEPENDENT_AMBULATORY_CARE_PROVIDER_SITE_OTHER): Payer: 59 | Admitting: Family Medicine

## 2014-03-23 ENCOUNTER — Encounter: Payer: Self-pay | Admitting: Family Medicine

## 2014-03-23 VITALS — BP 126/84 | HR 97 | Ht 63.0 in | Wt 138.0 lb

## 2014-03-23 DIAGNOSIS — M79609 Pain in unspecified limb: Secondary | ICD-10-CM

## 2014-03-23 DIAGNOSIS — M654 Radial styloid tenosynovitis [de Quervain]: Secondary | ICD-10-CM

## 2014-03-23 DIAGNOSIS — M79646 Pain in unspecified finger(s): Secondary | ICD-10-CM

## 2014-03-23 MED ORDER — PREDNISONE (PAK) 10 MG PO TABS: ORAL_TABLET | ORAL | Status: DC

## 2014-03-23 NOTE — Patient Instructions (Signed)
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 Avoid painful activities as much as possible. Wear the thumb spica brace as often as possible to rest this. Ice 15 minutes at a time 3-4 times a day A cortisone injection typically helps a great deal with this and is an option. Take prednisone dose pack for 6 days then can go back to taking ibuprofen or aleve. Physical therapy for iontophoresis is another consideration instead of an injection. You can repeat up to 2 injections but if these still do not help, surgical referral is indicated. Follow up with me in 1 month.

## 2014-03-24 ENCOUNTER — Encounter: Payer: Self-pay | Admitting: Family Medicine

## 2014-03-24 DIAGNOSIS — M79646 Pain in unspecified finger(s): Secondary | ICD-10-CM | POA: Insufficient documentation

## 2014-03-24 NOTE — Assessment & Plan Note (Signed)
Left thumb pain - most consistent with dequervains but more distal.  Continue thumb spica.  Prednisone dose pack x 6 days.  Icing as needed.  Consider cortisone injection.  F/u in 1 month.

## 2014-03-24 NOTE — Progress Notes (Signed)
Patient ID: Jenny Poole, female   DOB: 04/03/1981, 33 y.o.   MRN: [id number]  PCP: No PCP Per Patient  Subjective:   HPI: Patient is a 33 y.o. female here for left hand/wrist pain.  Patient reports last Wednesday on 6/10 left wrist and hand started hurting her. Bothering with all thumb motions. Is right handed. Types a lot at work. + swelling. Some localized tingling but not into other digits. Radiographs negative. Takes ibuprofen, uses thumb spica.  Past Medical History  Diagnosis Date  . Asthma     Current Outpatient Prescriptions on File Prior to Visit  Medication Sig Dispense Refill  . albuterol (VENTOLIN HFA) 108 (90 BASE) MCG/ACT inhaler 2-puffs every 4 hours  as needed for wheezing   3.7 g  0  . ALPRAZolam (XANAX) 0.25 MG tablet Take 0.25 mg by mouth every 8 (eight) hours as needed. For panic attack       . fluticasone (FLONASE) 50 MCG/ACT nasal spray 2 sprays by Nasal route daily. For rhinitis, sinusitis       . HYDROcodone-acetaminophen (NORCO/VICODIN) 5-325 MG per tablet Take 1 tablet by mouth every 4 (four) hours as needed for moderate pain.  12 tablet  0  . medroxyPROGESTERone (PROVERA) 10 MG tablet Take 10 mg by mouth daily. Take one tab daily for 10 days        . valACYclovir (VALTREX) 1000 MG tablet Take 1,000 mg by mouth. Take 2 tabs by mouth x 1 at the first sign of fever blister       . varenicline (CHANTIX STARTING MONTH PAK) 0.5 MG X 11 & 1 MG X 42 tablet Take 0.5 mg by mouth 2 (two) times daily. Take one 0.5mg  tablet by mouth once daily for 3 days, then increase to one 0.5mg  tablet twice daily for 4 days, then increase to one 1mg  tablet daily. Disp 1 starter pack       . varenicline (CHANTIX) 1 MG tablet Take 1 mg by mouth daily. 1 tab by mouth daily after you finish starter pack        No current facility-administered medications on file prior to visit.    Past Surgical History  Procedure Laterality Date  . Cesarean section    . Lasik    . Cervical  biopsy  w/ loop electrode excision    . Cesarean section    . Wisdom tooth extraction      Allergies  Allergen Reactions  . Penicillins     REACTION: swelling    History   Social History  . Marital Status: Single    Spouse Name: N/A    Number of Children: N/A  . Years of Education: N/A   Occupational History  . Not on file.   Social History Main Topics  . Smoking status: Current Every Day Smoker -- 0.50 packs/day    Types: Cigarettes  . Smokeless tobacco: Not on file  . Alcohol Use: No  . Drug Use: No  . Sexual Activity: Not on file   Other Topics Concern  . Not on file   Social History Narrative  . No narrative on file    No family history on file.  BP 126/84  Pulse 97  Ht 5\' 3"  (1.6 m)  Wt 138 lb (62.596 kg)  BMI 24.45 kg/m2  LMP 03/20/2014  Review of Systems: See HPI above.    Objective:  Physical Exam:  Gen: NAD  Left hand/wrist: No gross deformity, swelling, bruising. TTP dorsal aspect  of left thumb back to wrist.  No CMC, carpal tunnel tenderness. FROM thumb, wrist but pain with all motions of thumb. Negative tinels. Positive finkelsteins. Able to flex and extend at thumb MCP and IP joints.    Assessment & Plan:  1. Left thumb pain - most consistent with dequervains but more distal.  Continue thumb spica.  Prednisone dose pack x 6 days.  Icing as needed.  Consider cortisone injection.  F/u in 1 month.

## 2014-04-20 ENCOUNTER — Ambulatory Visit: Payer: 59 | Admitting: Family Medicine

## 2014-09-26 IMAGING — CR DG HAND COMPLETE 3+V*L*
3 series · 3 of 3 positions shown · non-contrast
Comparison: None.

CLINICAL DATA: Hand pain since 1 day.  No injury.

EXAM:
LEFT HAND - COMPLETE 3+ VIEW

[x hand pa left]
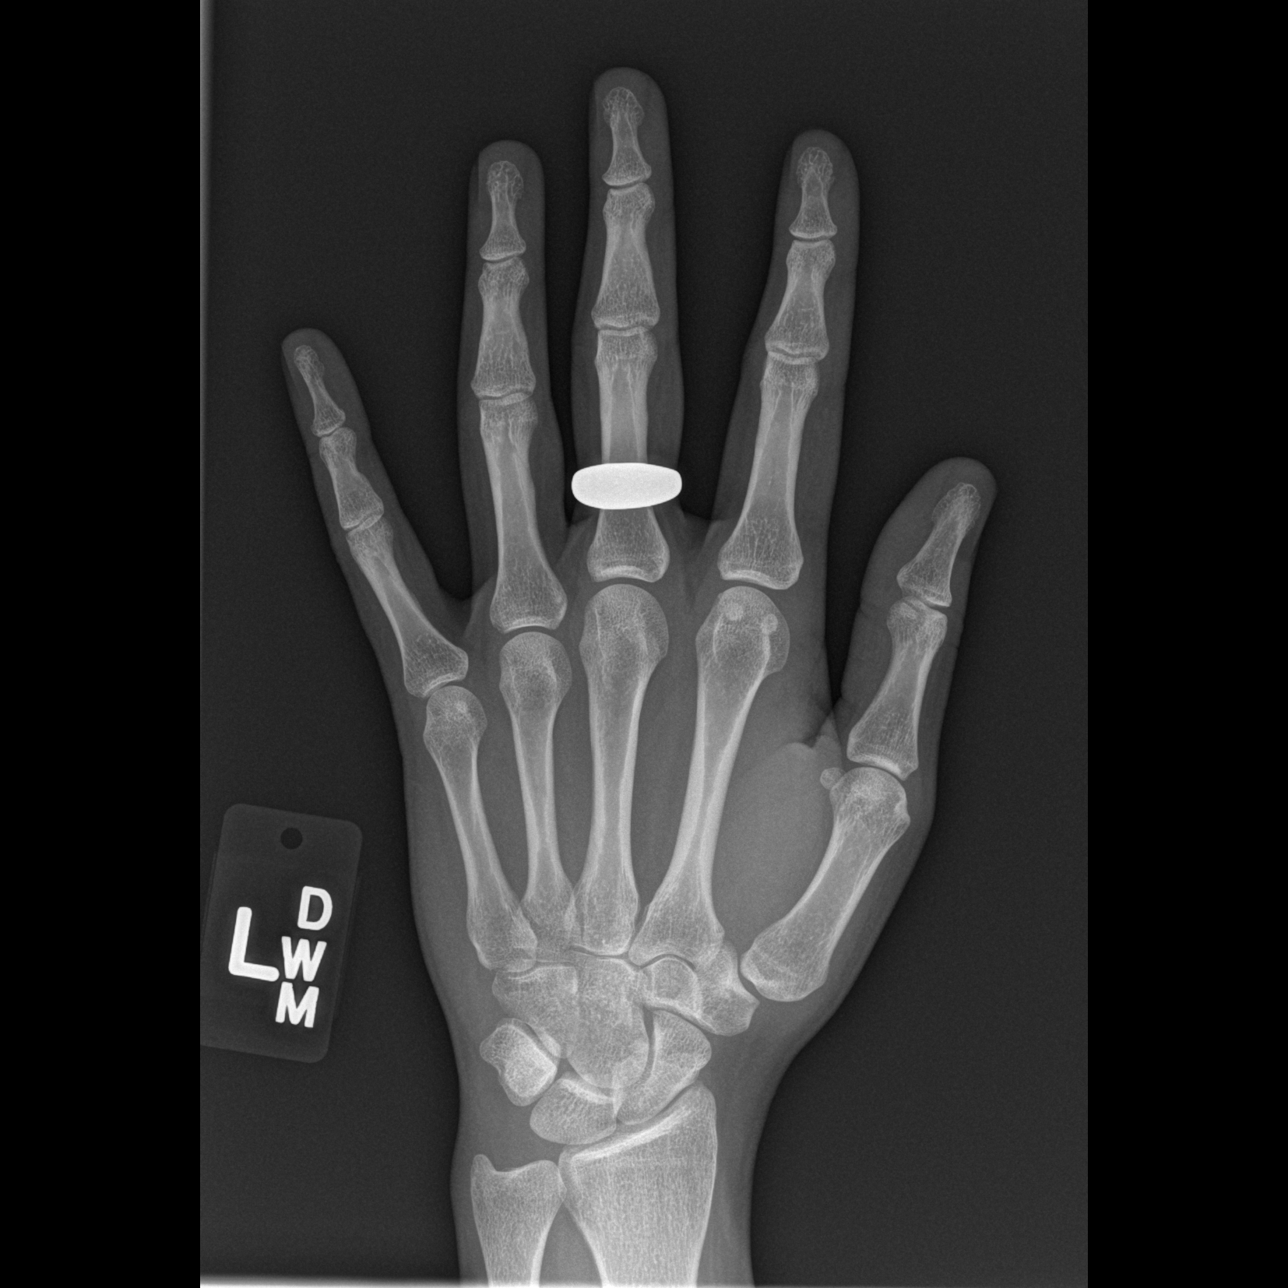

[x hand oblique left]
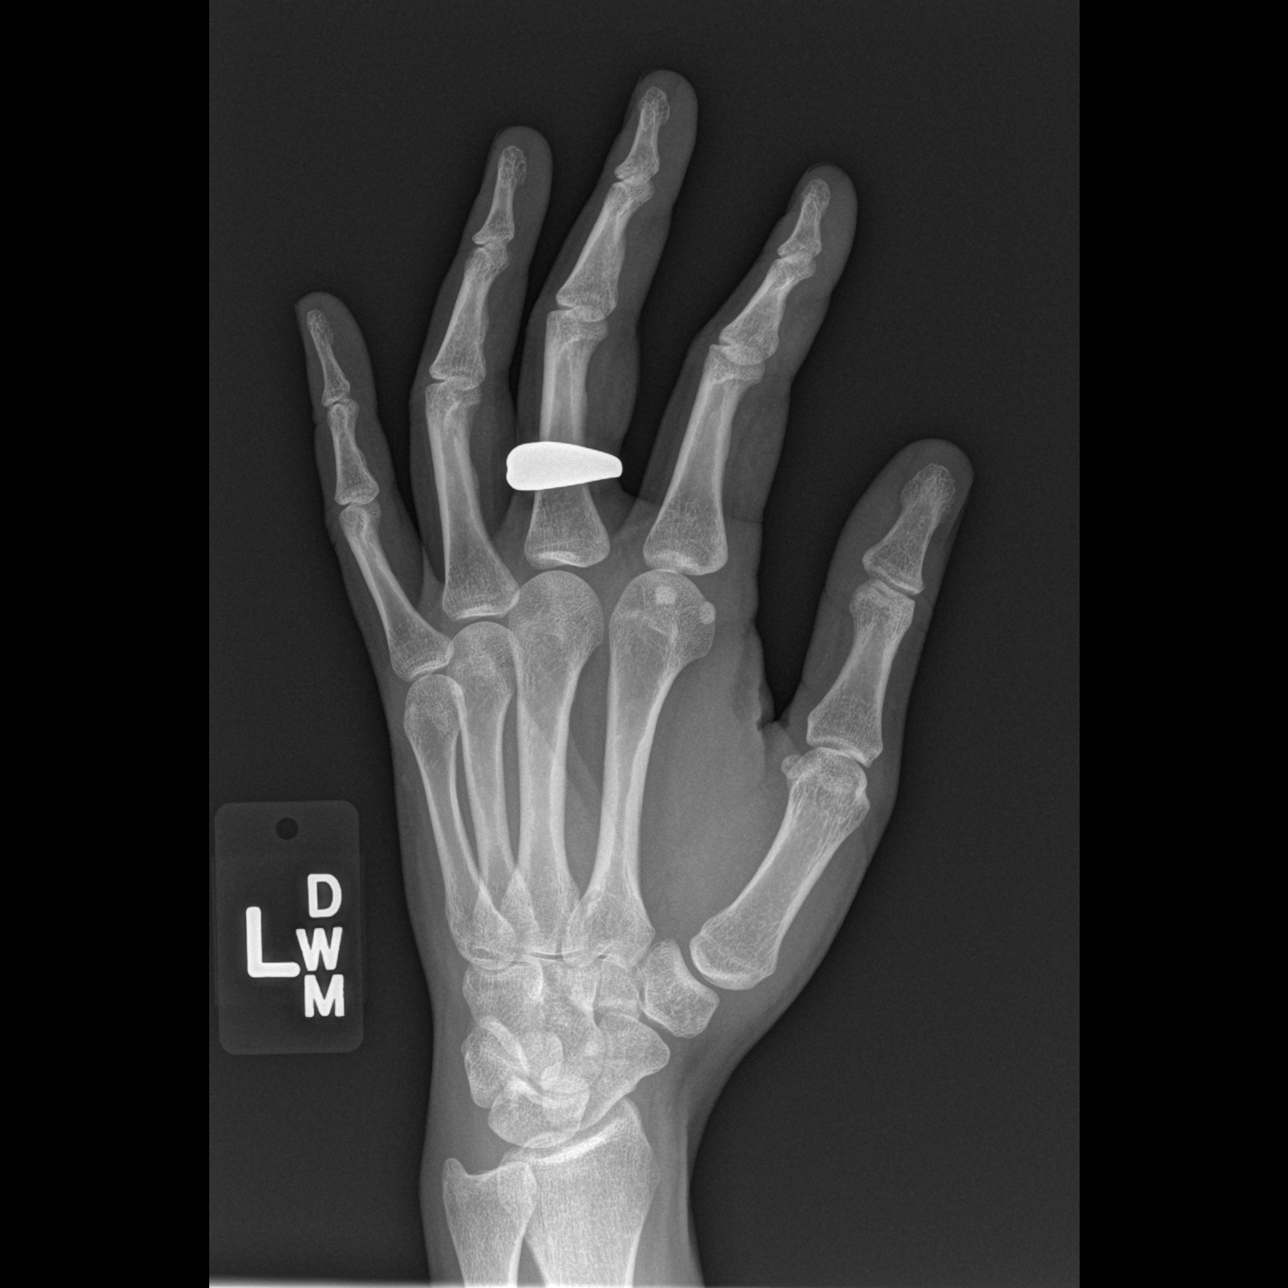

[x hand lat left]
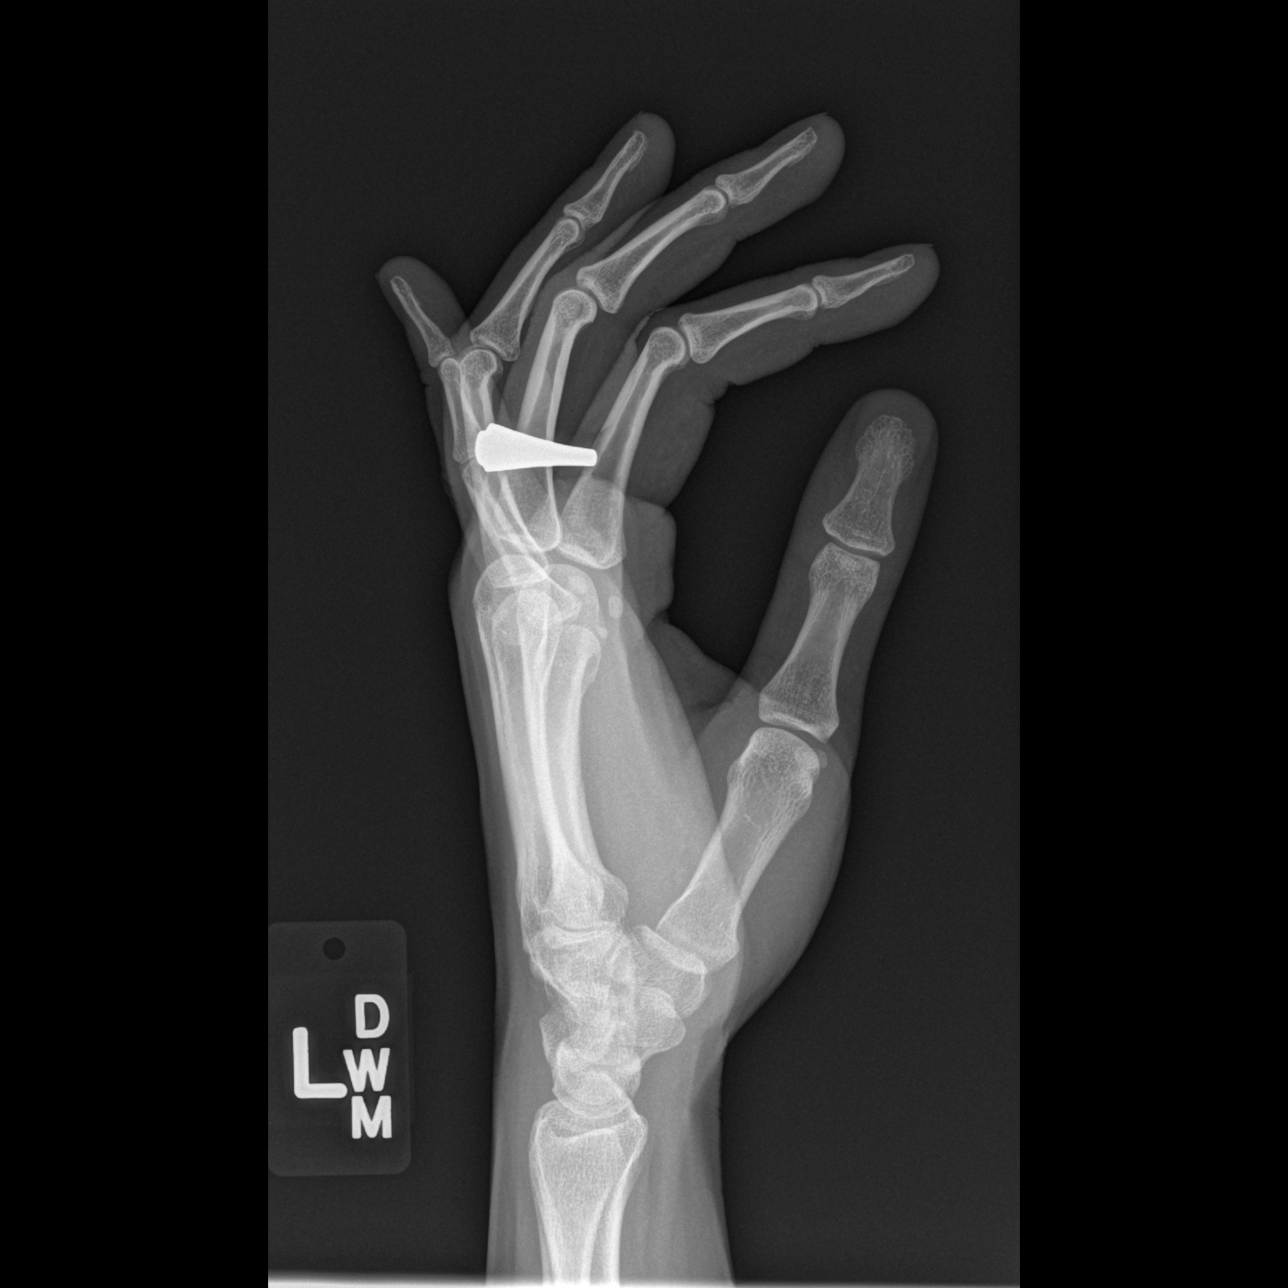

[3 of 3 positions shown; findings below may reference images not displayed]

FINDINGS: There is no evidence of fracture or dislocation. There is no
evidence of arthropathy or other focal bone abnormality. Soft
tissues are unremarkable.
IMPRESSION: Negative.

## 2015-05-31 ENCOUNTER — Encounter (HOSPITAL_BASED_OUTPATIENT_CLINIC_OR_DEPARTMENT_OTHER): Payer: Self-pay

## 2015-05-31 ENCOUNTER — Emergency Department (HOSPITAL_BASED_OUTPATIENT_CLINIC_OR_DEPARTMENT_OTHER)
Admission: EM | Admit: 2015-05-31 | Discharge: 2015-05-31 | Disposition: A | Discharge status: Home / Self Care | Payer: Commercial Managed Care - PPO | Attending: Emergency Medicine | Admitting: Emergency Medicine

## 2015-05-31 DIAGNOSIS — Z880 Allergy status to penicillin: Secondary | ICD-10-CM | POA: Diagnosis not present

## 2015-05-31 DIAGNOSIS — Z720 Tobacco use: Secondary | ICD-10-CM | POA: Insufficient documentation

## 2015-05-31 DIAGNOSIS — Z3202 Encounter for pregnancy test, result negative: Secondary | ICD-10-CM | POA: Insufficient documentation

## 2015-05-31 DIAGNOSIS — M545 Low back pain: Secondary | ICD-10-CM | POA: Diagnosis present

## 2015-05-31 DIAGNOSIS — J45909 Unspecified asthma, uncomplicated: Secondary | ICD-10-CM | POA: Diagnosis not present

## 2015-05-31 DIAGNOSIS — Z7951 Long term (current) use of inhaled steroids: Secondary | ICD-10-CM | POA: Insufficient documentation

## 2015-05-31 DIAGNOSIS — Z79899 Other long term (current) drug therapy: Secondary | ICD-10-CM | POA: Diagnosis not present

## 2015-05-31 DIAGNOSIS — M5432 Sciatica, left side: Secondary | ICD-10-CM

## 2015-05-31 LAB — URINALYSIS, ROUTINE W REFLEX MICROSCOPIC
Bilirubin Urine: NEGATIVE
Glucose, UA: NEGATIVE mg/dL
Ketones, ur: NEGATIVE mg/dL
LEUKOCYTES UA: NEGATIVE
NITRITE: NEGATIVE
PROTEIN: NEGATIVE mg/dL
SPECIFIC GRAVITY, URINE: 1.009 (ref 1.005–1.030)
UROBILINOGEN UA: 0.2 mg/dL (ref 0.0–1.0)
pH: 6 (ref 5.0–8.0)

## 2015-05-31 LAB — URINE MICROSCOPIC-ADD ON

## 2015-05-31 LAB — PREGNANCY, URINE: PREG TEST UR: NEGATIVE

## 2015-05-31 MED ORDER — DIAZEPAM 5 MG PO TABS: 5.0000 mg | ORAL_TABLET | Freq: Four times a day (QID) | ORAL | Status: AC | PRN

## 2015-05-31 MED ORDER — HYDROMORPHONE HCL 1 MG/ML IJ SOLN
0.5000 mg | Freq: Once | INTRAMUSCULAR | Status: AC
  Administered 2015-05-31: 0.5 mg via INTRAMUSCULAR
  Filled 2015-05-31: qty 1

## 2015-05-31 MED ORDER — OXYCODONE-ACETAMINOPHEN 5-325 MG PO TABS
1.0000 | ORAL_TABLET | ORAL | Status: DC | PRN
Start: 2015-05-31 — End: 2017-04-06

## 2015-05-31 MED ORDER — DIAZEPAM 5 MG PO TABS
5.0000 mg | ORAL_TABLET | Freq: Once | ORAL | Status: AC
  Administered 2015-05-31: 5 mg via ORAL
  Filled 2015-05-31: qty 1

## 2015-05-31 NOTE — ED Notes (Signed)
Left lower back pain since Sunday morning. Radiating into left hip and down the leg. Denies urinary symptoms.

## 2015-05-31 NOTE — ED Provider Notes (Signed)
CSN: [id number]     Arrival date & time 05/31/15  1025 History   First MD Initiated Contact with Patient 05/31/15 1032     Chief Complaint  Patient presents with  . Back Pain     (Consider location/radiation/quality/duration/timing/severity/associated sxs/prior Treatment) Patient is a 34 y.o. female presenting with back pain.  Back Pain Location:  Lumbar spine Quality:  Stabbing Radiates to: L leg. Pain severity:  Severe Pain is:  Same all the time Onset quality:  Gradual Duration:  2 days Timing:  Constant Progression:  Unchanged Chronicity:  Recurrent Context: not lifting heavy objects, not recent illness and not recent injury   Relieved by: standing. Exacerbated by: sitting. Associated symptoms: no abdominal pain, no bladder incontinence, no bowel incontinence, no chest pain, no fever, no numbness, no paresthesias, no perianal numbness, no tingling and no weakness     Past Medical History  Diagnosis Date  . Asthma    Past Surgical History  Procedure Laterality Date  . Cesarean section    . Lasik    . Cervical biopsy  w/ loop electrode excision    . Cesarean section    . Wisdom tooth extraction     No family history on file. Social History  Substance Use Topics  . Smoking status: Current Every Day Smoker -- 0.50 packs/day    Types: Cigarettes  . Smokeless tobacco: None  . Alcohol Use: No   OB History    No data available     Review of Systems  Constitutional: Negative for fever.  Cardiovascular: Negative for chest pain.  Gastrointestinal: Negative for abdominal pain and bowel incontinence.  Genitourinary: Negative for bladder incontinence.  Musculoskeletal: Positive for back pain.  Neurological: Negative for tingling, weakness, numbness and paresthesias.  All other systems reviewed and are negative.     Allergies  Penicillins  Home Medications   Prior to Admission medications   Medication Sig Start Date End Date Taking? Authorizing Provider   albuterol (VENTOLIN HFA) 108 (90 BASE) MCG/ACT inhaler 2-puffs every 4 hours  as needed for wheezing  05/01/11   Zachery Dauer, MD  ALPRAZolam Prudy Feeler) 0.25 MG tablet Take 0.25 mg by mouth every 8 (eight) hours as needed. For panic attack     Historical Provider, MD  fluticasone (FLONASE) 50 MCG/ACT nasal spray 2 sprays by Nasal route daily. For rhinitis, sinusitis     Historical Provider, MD  HYDROcodone-acetaminophen (NORCO/VICODIN) 5-325 MG per tablet Take 1 tablet by mouth every 4 (four) hours as needed for moderate pain. 03/20/14   Azalia Bilis, MD  medroxyPROGESTERone (PROVERA) 10 MG tablet Take 10 mg by mouth daily. Take one tab daily for 10 days      Historical Provider, MD  predniSONE (STERAPRED UNI-PAK) 10 MG tablet 6 tabs po day 1, 5 tabs po day 2, 4 tabs po day 3, 3 tabs po day 4, 2 tabs po day 5, 1 tab po day 6 03/23/14   Lenda Kelp, MD  valACYclovir (VALTREX) 1000 MG tablet Take 1,000 mg by mouth. Take 2 tabs by mouth x 1 at the first sign of fever blister     Historical Provider, MD  varenicline (CHANTIX STARTING MONTH PAK) 0.5 MG X 11 & 1 MG X 42 tablet Take 0.5 mg by mouth 2 (two) times daily. Take one 0.5mg  tablet by mouth once daily for 3 days, then increase to one 0.5mg  tablet twice daily for 4 days, then increase to one 1mg  tablet daily. Disp 1 starter pack  Historical Provider, MD  varenicline (CHANTIX) 1 MG tablet Take 1 mg by mouth daily. 1 tab by mouth daily after you finish starter pack     Historical Provider, MD   BP 113/91 mmHg  Pulse 110  Temp(Src) 98.4 F (36.9 C) (Oral)  Resp 18  Ht  (1.6 m)  Wt 143 lb (64.864 kg)  BMI 25.34 kg/m2  SpO2 98% Physical Exam  Constitutional: She is oriented to person, place, and time. She appears well-developed and well-nourished.  HENT:  Head: Normocephalic and atraumatic.  Right Ear: External ear normal.  Left Ear: External ear normal.  Eyes: Conjunctivae and EOM are normal. Pupils are equal, round, and reactive to  light.  Neck: Normal range of motion. Neck supple.  Cardiovascular: Normal rate, regular rhythm, normal heart sounds and intact distal pulses.   Pulmonary/Chest: Effort normal and breath sounds normal.  Abdominal: Soft. Bowel sounds are normal. There is no tenderness.  Musculoskeletal: Normal range of motion.       Lumbar back: She exhibits tenderness (reproduction of symptoms with palpation of L paraspinal muscles).  Neurological: She is alert and oriented to person, place, and time.  Skin: Skin is warm and dry.  Vitals reviewed.   ED Course  Procedures (including critical care time) Labs Review Labs Reviewed - No data to display  Imaging Review No results found. I have personally reviewed and evaluated these images and lab results as part of my medical decision-making.   EKG Interpretation None      MDM   Final diagnoses:  None    34 y.o. female with pertinent PMH of prior back pain presents with recurrent atraumatic back pain. No fevers, GI symptoms, respiratory symptoms. Exam as above consistent with sciatica, patient has exact reproduction of pain with palpation of left paraspinal muscles. Offered radiography to patient as she also had midline tenderness which the patient refused. Discharged home with standard return precautions..    I have reviewed all laboratory and imaging studies if ordered as above  1. Sciatica, left         Mirian Mo, MD 05/31/15 [phone]

## 2015-05-31 NOTE — ED Notes (Signed)
Pt attempting to urinate at this time

## 2015-05-31 NOTE — Discharge Instructions (Signed)
Sciatica Sciatica is pain, weakness, numbness, or tingling along the path of the sciatic nerve. The nerve starts in the lower back and runs down the back of each leg. The nerve controls the muscles in the lower leg and in the back of the knee, while also providing sensation to the back of the thigh, lower leg, and the sole of your foot. Sciatica is a symptom of another medical condition. For instance, nerve damage or certain conditions, such as a herniated disk or bone spur on the spine, pinch or put pressure on the sciatic nerve. This causes the pain, weakness, or other sensations normally associated with sciatica. Generally, sciatica only affects one side of the body. CAUSES   Herniated or slipped disc.  Degenerative disk disease.  A pain disorder involving the narrow muscle in the buttocks (piriformis syndrome).  Pelvic injury or fracture.  Pregnancy.  Tumor (rare). SYMPTOMS  Symptoms can vary from mild to very severe. The symptoms usually travel from the low back to the buttocks and down the back of the leg. Symptoms can include:  Mild tingling or dull aches in the lower back, leg, or hip.  Numbness in the back of the calf or sole of the foot.  Burning sensations in the lower back, leg, or hip.  Sharp pains in the lower back, leg, or hip.  Leg weakness.  Severe back pain inhibiting movement. These symptoms may get worse with coughing, sneezing, laughing, or prolonged sitting or standing. Also, being overweight may worsen symptoms. DIAGNOSIS  Your caregiver will perform a physical exam to look for common symptoms of sciatica. He or she may ask you to do certain movements or activities that would trigger sciatic nerve pain. Other tests may be performed to find the cause of the sciatica. These may include:  Blood tests.  X-rays.  Imaging tests, such as an MRI or CT scan. TREATMENT  Treatment is directed at the cause of the sciatic pain. Sometimes, treatment is not necessary  and the pain and discomfort goes away on its own. If treatment is needed, your caregiver may suggest:  Over-the-counter medicines to relieve pain.  Prescription medicines, such as anti-inflammatory medicine, muscle relaxants, or narcotics.  Applying heat or ice to the painful area.  Steroid injections to lessen pain, irritation, and inflammation around the nerve.  Reducing activity during periods of pain.  Exercising and stretching to strengthen your abdomen and improve flexibility of your spine. Your caregiver may suggest losing weight if the extra weight makes the back pain worse.  Physical therapy.  Surgery to eliminate what is pressing or pinching the nerve, such as a bone spur or part of a herniated disk. HOME CARE INSTRUCTIONS   Only take over-the-counter or prescription medicines for pain or discomfort as directed by your caregiver.  Apply ice to the affected area for 20 minutes, 3-4 times a day for the first 48-72 hours. Then try heat in the same way.  Exercise, stretch, or perform your usual activities if these do not aggravate your pain.  Attend physical therapy sessions as directed by your caregiver.  Keep all follow-up appointments as directed by your caregiver.  Do not wear high heels or shoes that do not provide proper support.  Check your mattress to see if it is too soft. A firm mattress may lessen your pain and discomfort. SEEK IMMEDIATE MEDICAL CARE IF:   You lose control of your bowel or bladder (incontinence).  You have increasing weakness in the lower back, pelvis, buttocks,   or legs.  You have redness or swelling of your back.  You have a burning sensation when you urinate.  You have pain that gets worse when you lie down or awakens you at night.  Your pain is worse than you have experienced in the past.  Your pain is lasting longer than 4 weeks.  You are suddenly losing weight without reason. MAKE SURE YOU:  Understand these  instructions.  Will watch your condition.  Will get help right away if you are not doing well or get worse. Document Released: 09/18/2001 Document Revised: 03/25/2012 Document Reviewed: 02/03/2012 [employer] Patient Information 2015 [employer]. This information is not intended to replace advice given to you by your health care provider. Make sure you discuss any questions you have with your health care provider.  

## 2015-05-31 NOTE — ED Notes (Signed)
MD at bedside. 

## 2017-04-06 ENCOUNTER — Emergency Department (HOSPITAL_BASED_OUTPATIENT_CLINIC_OR_DEPARTMENT_OTHER)
Admission: EM | Admit: 2017-04-06 | Discharge: 2017-04-06 | Disposition: A | Discharge status: Home / Self Care | Payer: Commercial Managed Care - PPO | Attending: Emergency Medicine | Admitting: Emergency Medicine

## 2017-04-06 ENCOUNTER — Emergency Department (HOSPITAL_BASED_OUTPATIENT_CLINIC_OR_DEPARTMENT_OTHER): Payer: Commercial Managed Care - PPO

## 2017-04-06 ENCOUNTER — Encounter (HOSPITAL_BASED_OUTPATIENT_CLINIC_OR_DEPARTMENT_OTHER): Payer: Self-pay | Admitting: *Deleted

## 2017-04-06 DIAGNOSIS — Z79899 Other long term (current) drug therapy: Secondary | ICD-10-CM | POA: Diagnosis not present

## 2017-04-06 DIAGNOSIS — Z87891 Personal history of nicotine dependence: Secondary | ICD-10-CM | POA: Insufficient documentation

## 2017-04-06 DIAGNOSIS — R0602 Shortness of breath: Secondary | ICD-10-CM | POA: Diagnosis present

## 2017-04-06 DIAGNOSIS — E039 Hypothyroidism, unspecified: Secondary | ICD-10-CM | POA: Insufficient documentation

## 2017-04-06 DIAGNOSIS — J4521 Mild intermittent asthma with (acute) exacerbation: Secondary | ICD-10-CM | POA: Insufficient documentation

## 2017-04-06 DIAGNOSIS — R0781 Pleurodynia: Secondary | ICD-10-CM | POA: Diagnosis not present

## 2017-04-06 HISTORY — DX: Disorder of thyroid, unspecified: E07.9

## 2017-04-06 LAB — BASIC METABOLIC PANEL
Anion gap: 12 (ref 5–15)
BUN: 8 mg/dL (ref 6–20)
CALCIUM: 9.6 mg/dL (ref 8.9–10.3)
CHLORIDE: 102 mmol/L (ref 101–111)
CO2: 23 mmol/L (ref 22–32)
CREATININE: 0.68 mg/dL (ref 0.44–1.00)
GFR calc Af Amer: >60 mL/min (ref >60)
GFR calc non Af Amer: >60 mL/min (ref >60)
GLUCOSE: 93 mg/dL (ref 65–99)
Potassium: 3.5 mmol/L (ref 3.5–5.1)
Sodium: 137 mmol/L (ref 135–145)

## 2017-04-06 LAB — TROPONIN I: Troponin I: <0.03 ng/mL (ref <0.03)

## 2017-04-06 LAB — CBC
HCT: 40.5 % (ref 36.0–46.0)
HEMOGLOBIN: 14.7 g/dL (ref 12.0–15.0)
MCH: 32 pg (ref 26.0–34.0)
MCHC: 36.3 g/dL — AB (ref 30.0–36.0)
MCV: 88.2 fL (ref 78.0–100.0)
PLATELETS: 302 10*3/uL (ref 150–400)
RBC: 4.59 MIL/uL (ref 3.87–5.11)
RDW: 11.5 % (ref 11.5–15.5)
WBC: 7.5 10*3/uL (ref 4.0–10.5)

## 2017-04-06 LAB — D-DIMER, QUANTITATIVE: D-Dimer, Quant: <0.27 ug/mL-FEU (ref 0.00–0.50)

## 2017-04-06 MED ORDER — KETOROLAC TROMETHAMINE 15 MG/ML IJ SOLN
15.0000 mg | Freq: Once | INTRAMUSCULAR | Status: AC
  Administered 2017-04-06: 15 mg via INTRAVENOUS
  Filled 2017-04-06: qty 1

## 2017-04-06 MED ORDER — PREDNISONE 20 MG PO TABS
40.0000 mg | ORAL_TABLET | Freq: Once | ORAL | Status: AC
  Administered 2017-04-06: 40 mg via ORAL
  Filled 2017-04-06: qty 2

## 2017-04-06 MED ORDER — ALBUTEROL SULFATE HFA 108 (90 BASE) MCG/ACT IN AERS
1.0000 | INHALATION_SPRAY | Freq: Once | RESPIRATORY_TRACT | Status: AC
  Administered 2017-04-06: 1 via RESPIRATORY_TRACT
  Filled 2017-04-06: qty 6.7

## 2017-04-06 MED ORDER — PREDNISONE 10 MG PO TABS: 40.0000 mg | ORAL_TABLET | Freq: Every day | ORAL | 0 refills | Status: DC

## 2017-04-06 MED ORDER — IPRATROPIUM-ALBUTEROL 0.5-2.5 (3) MG/3ML IN SOLN
3.0000 mL | Freq: Once | RESPIRATORY_TRACT | Status: AC
  Administered 2017-04-06: 3 mL via RESPIRATORY_TRACT
  Filled 2017-04-06: qty 3

## 2017-04-06 NOTE — ED Notes (Signed)
Patient went to xray 

## 2017-04-06 NOTE — ED Provider Notes (Signed)
[facility] Provider Note   CSN: [id number] Arrival date & time: 04/06/17  1507  By signing my name below, I, Jenny Poole, attest that this documentation has been prepared under the direction and in the presence of No att. providers found. Electronically Signed: Thelma Poole, Scribe. 04/06/17. 4:14 PM.  History   Chief Complaint Chief Complaint  Patient presents with  . Shortness of Breath  . Chest Pain   The history is provided by the patient. No language interpreter was used.    HPI Comments: Jenny Poole is a 36 y.o. female with PMHx of asthma, pneumonia, and hypothyroidism who presents to the Emergency Department complaining of constant, gradually worsening SOB since today. She has associated sharp, shooting central CP since yesterday, upper central back pain, and hot flashes. She has tried using albuterol with no relief. She denies fever, cough, abdominal pain, swelling in legs, throat swelling, and anxiousness/nervousness. She further denies taking birth control, being pregnant, recent surgeries or hospitalizations. Pt has felt like this before when she had pneumonia 1 month ago. She regularly takes levathoroxine for her thyroid condition.   Past Medical History:  Diagnosis Date  . Asthma   . Thyroid disease     Patient Active Problem List   Diagnosis Date Noted  . Thumb pain 03/24/2014  . VAGINAL BLEEDING, ABNORMAL 08/02/2010  . ABNORMAL GLANDULAR PAPANICOLAOU SMEAR OF CERVIX 08/02/2010  . MENORRHAGIA 03/13/2010  . HERPES LABIALIS 06/07/2009  . TOBACCO ABUSE 09/03/2007  . ANEMIA, IRON DEFICIENCY, UNSPEC. 12/05/2006  . ANXIETY 12/05/2006  . DEPRESSIVE DISORDER, NOS 12/05/2006  . RHINITIS, ALLERGIC 12/05/2006  . ACNE 12/05/2006    Past Surgical History:  Procedure Laterality Date  . CERVICAL BIOPSY  W/ LOOP ELECTRODE EXCISION    . CESAREAN SECTION    . CESAREAN SECTION    . LASIK    . uterine ablation    . WISDOM TOOTH EXTRACTION      OB History     No data available       Home Medications    Prior to Admission medications   Medication Sig Start Date End Date Taking? Authorizing Provider  albuterol (VENTOLIN HFA) 108 (90 BASE) MCG/ACT inhaler 2-puffs every 4 hours  as needed for wheezing  05/01/11  Yes Zachery Dauer, MD  diazepam (VALIUM) 5 MG tablet Take 1 tablet (5 mg total) by mouth every 6 (six) hours as needed for anxiety (spasms). 05/31/15  Yes Mirian Mo, MD  fluticasone [facility]) 50 MCG/ACT nasal spray 2 sprays by Nasal route daily. For rhinitis, sinusitis    Yes [provider]  levothyroxine (SYNTHROID, LEVOTHROID) 125 MCG tablet Take 125 mcg by mouth daily before breakfast.   Yes [provider]  predniSONE (DELTASONE) 10 MG tablet Take 4 tablets (40 mg total) by mouth daily. 04/06/17   Tilden Fossa, MD    Family History No family history on file.  Social History Social History  Substance Use Topics  . Smoking status: Former Smoker    Packs/day: 0.50    Types: Cigarettes  . Smokeless tobacco: Never Used  . Alcohol use Yes     Comment: occ     Allergies   Penicillins   Review of Systems Review of Systems  Constitutional: Negative for fever.       Positive for: hot flashes  HENT: Negative for trouble swallowing.   Respiratory: Positive for shortness of breath. Negative for cough.   Cardiovascular: Positive for chest pain. Negative for leg swelling.  Gastrointestinal: Negative for abdominal pain.  Musculoskeletal: Positive for back pain.  Psychiatric/Behavioral: The patient is not nervous/anxious.   All other systems reviewed and are negative.    Physical Exam Updated Vital Signs BP 116/86 (BP Location: Left Arm)   Pulse 86   Temp 98.1 F (36.7 C) (Oral)   Resp 12   Ht 5\' 3"  (1.6 m)   Wt 74.8 kg (165 lb)   SpO2 100%   BMI 29.23 kg/m   Physical Exam  Constitutional: She is oriented to person, place, and time. She appears well-developed and well-nourished.  HENT:    Head: Normocephalic and atraumatic.  Cardiovascular: Normal rate and regular rhythm.   No murmur heard. No chest tenderness  Pulmonary/Chest: Effort normal and breath sounds normal. No respiratory distress.  Abdominal: Soft. There is no tenderness. There is no rebound and no guarding.  Musculoskeletal: She exhibits no edema or tenderness.  Neurological: She is alert and oriented to person, place, and time.  Skin: Skin is warm and dry.  Psychiatric: Her behavior is normal.  Anxious appearing  Nursing note and vitals reviewed.    ED Treatments / Results  DIAGNOSTIC STUDIES: Oxygen Saturation is 100% on RA, normal by my interpretation.    COORDINATION OF CARE: 4:20 PM Discussed treatment plan with pt at bedside and pt agreed to plan.  Labs (all labs ordered are listed, but only abnormal results are displayed) Labs Reviewed  CBC - Abnormal; Notable for the following:       Result Value   MCHC 36.3 (*)    All other components within normal limits  BASIC METABOLIC PANEL  TROPONIN I  D-DIMER, QUANTITATIVE (NOT AT [facility])    EKG  EKG Interpretation  Date/Time:  Saturday Jenny 30 2018 15:14:32 EDT Ventricular Rate:  77 PR Interval:  130 QRS Duration: 74 QT Interval:  366 QTC Calculation: 414 R Axis:   85 Text Interpretation:  Normal sinus rhythm with sinus arrhythmia Normal ECG Confirmed by Lincoln Brigham [phone]) on 04/06/2017 3:20:40 PM       Radiology Dg Chest 2 View  Result Date: 04/06/2017 CLINICAL DATA:  Chest pain, shortness of Breath EXAM: CHEST  2 VIEW COMPARISON:  02/22/2017 FINDINGS: Heart and mediastinal contours are within normal limits. No focal opacities or effusions. No acute bony abnormality. IMPRESSION: No active cardiopulmonary disease. Electronically Signed   By: Charlett Nose M.D.   On: 04/06/2017 16:09    Procedures Procedures (including critical care time)  Medications Ordered in ED Medications  ketorolac (TORADOL) 15 MG/ML injection 15 mg (15 mg  Intravenous Given 04/06/17 1624)  ipratropium-albuterol (DUONEB) 0.5-2.5 (3) MG/3ML nebulizer solution 3 mL (3 mLs Nebulization Given 04/06/17 1624)  predniSONE (DELTASONE) tablet 40 mg (40 mg Oral Given 04/06/17 1735)  albuterol (PROVENTIL HFA;VENTOLIN HFA) 108 (90 Base) MCG/ACT inhaler 1 puff (1 puff Inhalation Given 04/06/17 1728)     Initial Impression / Assessment and Plan / ED Course  I have reviewed the triage vital signs and the nursing notes.  Pertinent labs & imaging results that were available during my care of the patient were reviewed by me and considered in my medical decision making (see chart for details).     Patient here for evaluation of shortness of breath and pleuritic chest pain. Not consistent with pneumonia, PE, ACS. She has no significant wheezing on examination but given her subjective dyspnea and pain we'll treat for asthma exacerbation with steroids, treatments. Discussed NSAIDs for chest wall pain/pleurisy. Discussed home care, outpatient  follow-up and return precautions.  Final Clinical Impressions(s) / ED Diagnoses   Final diagnoses:  Mild intermittent asthma with exacerbation  Pleuritic chest pain    New Prescriptions Discharge Medication List as of 04/06/2017  5:19 PM    START taking these medications   Details  predniSONE (DELTASONE) 10 MG tablet Take 4 tablets (40 mg total) by mouth daily., Starting Sat 04/06/2017, Print      I personally performed the services described in this documentation, which was scribed in my presence. The recorded information has been reviewed and is accurate.     Tilden Fossaees, Ari Bernabei, MD 04/06/17 2322

## 2017-04-06 NOTE — Discharge Instructions (Signed)
You can take Aleve, available over the counter, twice daily for pain.

## 2017-04-06 NOTE — ED Notes (Signed)
Notified MD of pt's presenting symptoms and current standing orders -- no new orders needed at this time per MD.

## 2017-04-06 NOTE — ED Triage Notes (Signed)
Pt reports chest pain and sob since yesterday. States she used her breathing tx with no effect. Reports n/v several days ago that's resolved. Denies fever, diarrhea.

## 2017-06-19 ENCOUNTER — Encounter (HOSPITAL_BASED_OUTPATIENT_CLINIC_OR_DEPARTMENT_OTHER): Payer: Self-pay | Admitting: *Deleted

## 2017-06-19 ENCOUNTER — Emergency Department (HOSPITAL_BASED_OUTPATIENT_CLINIC_OR_DEPARTMENT_OTHER)
Admission: EM | Admit: 2017-06-19 | Discharge: 2017-06-19 | Disposition: A | Discharge status: Home / Self Care | Payer: Commercial Managed Care - PPO | Attending: Emergency Medicine | Admitting: Emergency Medicine

## 2017-06-19 DIAGNOSIS — S39012A Strain of muscle, fascia and tendon of lower back, initial encounter: Secondary | ICD-10-CM | POA: Insufficient documentation

## 2017-06-19 DIAGNOSIS — Y929 Unspecified place or not applicable: Secondary | ICD-10-CM | POA: Diagnosis not present

## 2017-06-19 DIAGNOSIS — Z79899 Other long term (current) drug therapy: Secondary | ICD-10-CM | POA: Diagnosis not present

## 2017-06-19 DIAGNOSIS — Y999 Unspecified external cause status: Secondary | ICD-10-CM | POA: Diagnosis not present

## 2017-06-19 DIAGNOSIS — J45909 Unspecified asthma, uncomplicated: Secondary | ICD-10-CM | POA: Insufficient documentation

## 2017-06-19 DIAGNOSIS — Z87891 Personal history of nicotine dependence: Secondary | ICD-10-CM | POA: Diagnosis not present

## 2017-06-19 DIAGNOSIS — T148XXA Other injury of unspecified body region, initial encounter: Secondary | ICD-10-CM

## 2017-06-19 DIAGNOSIS — Y939 Activity, unspecified: Secondary | ICD-10-CM | POA: Diagnosis not present

## 2017-06-19 DIAGNOSIS — X58XXXA Exposure to other specified factors, initial encounter: Secondary | ICD-10-CM | POA: Diagnosis not present

## 2017-06-19 DIAGNOSIS — M545 Low back pain: Secondary | ICD-10-CM | POA: Diagnosis present

## 2017-06-19 MED ORDER — IBUPROFEN 800 MG PO TABS: 800.0000 mg | ORAL_TABLET | Freq: Once | ORAL | Status: DC

## 2017-06-19 MED ORDER — METHOCARBAMOL 500 MG PO TABS: 500.0000 mg | ORAL_TABLET | Freq: Two times a day (BID) | ORAL | 0 refills | Status: AC | PRN

## 2017-06-19 MED ORDER — IBUPROFEN 800 MG PO TABS: 800.0000 mg | ORAL_TABLET | Freq: Three times a day (TID) | ORAL | 0 refills | Status: AC

## 2017-06-19 MED FILL — IBUPROFEN 800 MG TABLET: 800 | 10 days supply | Qty: 30 | Fill #0

## 2017-06-19 MED FILL — METHOCARBAMOL 500 MG TABLET: 500 | 7 days supply | Qty: 15 | Fill #0

## 2017-06-19 NOTE — ED Triage Notes (Signed)
Pt reports awakening with low back pain yesterday morning. Denies any injury or trauma, states she had sciatica in the past and this feels similar, as pain is shooting to left hip also.

## 2017-06-19 NOTE — Discharge Instructions (Signed)
Take ibuprofen 3 times a day with meals for 7 days. Do not take other anti-inflammatories at same time (Advil, naproxen, Motrin, Aleve). You may supplement with Tylenol as needed for further pain control. You may take Robaxin twice a day as needed for muscle stiffness or soreness. Have caution, as this can sometimes make people tired. Do not drive or operate heavy machinery after taking this until you know how it affects you. Follow-up with your primary care doctor in 1 week if pain is not improving. Return to the emergency room if you develop fevers, chills, numbness, tingling, bowel or bladder control, or any new or worsening symptoms.

## 2017-06-19 NOTE — ED Provider Notes (Signed)
[facility]   CSN: [id number] Arrival date & time: 06/19/17  1036     History   Chief Complaint Chief Complaint  Patient presents with  . Back Pain    HPI Jenny Poole is a 36 y.o. female presenting with back pain  Patient states that when she woke up yesterday morning, she noticed low back pain. It is worse on the left side and radiates up her back. She reports a history of similar back pain, for which was diagnosed with sciatica, however at that time the pain radiated down her leg. She denies fall, trauma, or injury. She denies increased activity recently or change in beds. She denies pain on the right side. Lying still makes the pain better, and movement makes it worse. She is ambulatory with pain. She denies fevers, chills, rash, neck pain or stiffness, loss of bowel or bladder control, numbness, tingling. She denies history of cancer or IV drug use. She took one dose of ibuprofen last night, and a leftover Percocet with minimal relief.   HPI  Past Medical History:  Diagnosis Date  . Asthma   . Thyroid disease     Patient Active Problem List   Diagnosis Date Noted  . Thumb pain 03/24/2014  . VAGINAL BLEEDING, ABNORMAL 08/02/2010  . ABNORMAL GLANDULAR PAPANICOLAOU SMEAR OF CERVIX 08/02/2010  . MENORRHAGIA 03/13/2010  . HERPES LABIALIS 06/07/2009  . TOBACCO ABUSE 09/03/2007  . ANEMIA, IRON DEFICIENCY, UNSPEC. 12/05/2006  . ANXIETY 12/05/2006  . DEPRESSIVE DISORDER, NOS 12/05/2006  . RHINITIS, ALLERGIC 12/05/2006  . ACNE 12/05/2006    Past Surgical History:  Procedure Laterality Date  . CERVICAL BIOPSY  W/ LOOP ELECTRODE EXCISION    . CESAREAN SECTION    . CESAREAN SECTION    . LASIK    . uterine ablation    . WISDOM TOOTH EXTRACTION      OB History    No data available       Home Medications    Prior to Admission medications   Medication Sig Start Date End Date Taking? Authorizing Provider  albuterol (VENTOLIN HFA) 108 (90  BASE) MCG/ACT inhaler 2-puffs every 4 hours  as needed for wheezing  05/01/11   Zachery DauerHale, [place], MD  diazepam (VALIUM) 5 MG tablet Take 1 tablet (5 mg total) by mouth every 6 (six) hours as needed for anxiety (spasms). 05/31/15   Mirian MoGentry, Matthew, MD  ibuprofen (ADVIL,MOTRIN) 800 MG tablet Take 1 tablet (800 mg total) by mouth 3 (three) times daily with meals. 06/19/17   Burleigh Brockmann, PA-C  levothyroxine (SYNTHROID, LEVOTHROID) 125 MCG tablet Take 125 mcg by mouth daily before breakfast.    [provider]  methocarbamol (ROBAXIN) 500 MG tablet Take 1 tablet (500 mg total) by mouth 2 (two) times daily as needed for muscle spasms. 06/19/17   Shayley Medlin, PA-C    Family History History reviewed. No pertinent family history.  Social History Social History  Substance Use Topics  . Smoking status: Former Smoker    Packs/day: 0.50    Types: Cigarettes  . Smokeless tobacco: Never Used  . Alcohol use Yes     Comment: occ     Allergies   Penicillins   Review of Systems Review of Systems  Constitutional: Negative for chills and fever.  Eyes: Negative for visual disturbance.  Respiratory: Negative for cough, chest tightness and shortness of breath.   Cardiovascular: Negative for chest pain.  Gastrointestinal: Negative for abdominal pain, constipation, diarrhea, nausea  and vomiting.  Musculoskeletal: Positive for back pain. Negative for neck pain.  Skin: Negative for wound.  Neurological: Negative for dizziness, light-headedness and numbness.     Physical Exam Updated Vital Signs BP (!) 130/94 (BP Location: Right Arm)   Pulse 90   Temp 97.9 F (36.6 C) (Oral)   Resp 18   SpO2 98%   Physical Exam  Constitutional: She is oriented to person, place, and time. She appears well-developed and well-nourished. No distress.  HENT:  Head: Normocephalic and atraumatic.  Eyes: EOM are normal.  Neck: Normal range of motion.  No tenderness to palpation at midline cervical  spine. full active range of motion of head without pain  Cardiovascular: Normal rate, regular rhythm and intact distal pulses.   Pulmonary/Chest: Effort normal and breath sounds normal. No respiratory distress. She has no wheezes. She exhibits no tenderness.  Abdominal: Soft. Bowel sounds are normal. She exhibits no distension. There is no tenderness.  Musculoskeletal: Normal range of motion.  Tenderness to palpation of the left paraspinal muscles. No increased pain over midline spine. No pain of the right side back. Strength intact 4. Sensation intact 4. Radial and pedal pulses equal bilaterally. Negative straight leg raise.  Neurological: She is alert and oriented to person, place, and time.  Skin: Skin is warm and dry.  Psychiatric: She has a normal mood and affect.     ED Treatments / Results  Labs (all labs ordered are listed, but only abnormal results are displayed) Labs Reviewed - No data to display  EKG  EKG Interpretation None       Radiology No results found.  Procedures Procedures (including critical care time)  Medications Ordered in ED Medications  ibuprofen (ADVIL,MOTRIN) tablet 800 mg (not administered)     Initial Impression / Assessment and Plan / ED Course  I have reviewed the triage vital signs and the nursing notes.  Pertinent labs & imaging results that were available during my care of the patient were reviewed by me and considered in my medical decision making (see chart for details).     Presenting with 2 days of left-sided back pain. History of similar, diagnosis sciatica. Patient without numbness or tingling, and without radiation down the leg. Physical exam reassuring, as patient is tenderness to palpation of the musculature. No red flags of back pain. Doubt spinal cord injury, cauda equina, or sciatica at this time. Likely muscular pain. Will treat conservatively with NSAIDs and muscle relaxer. Patient to follow-up with primary care if symptoms  are not improving. At this time, patient appears safe for discharge. Return precautions given. Patient states she understands and agrees to plan.  Final Clinical Impressions(s) / ED Diagnoses   Final diagnoses:  Muscle strain    New Prescriptions Discharge Medication List as of 06/19/2017 11:16 AM    START taking these medications   Details  ibuprofen (ADVIL,MOTRIN) 800 MG tablet Take 1 tablet (800 mg total) by mouth 3 (three) times daily with meals., Starting Wed 06/19/2017, Print    methocarbamol (ROBAXIN) 500 MG tablet Take 1 tablet (500 mg total) by mouth 2 (two) times daily as needed for muscle spasms., Starting Wed 06/19/2017, Print         [place], [place], PA-C 06/19/17 1303    Melene Plan, DO 06/19/17 1523

## 2017-10-13 IMAGING — DX DG CHEST 2V
2 series · 2 of 2 positions shown · non-contrast
Comparison: 02/22/2017

CLINICAL DATA: Chest pain, shortness of Breath

EXAM:
CHEST  2 VIEW

[chest pa]
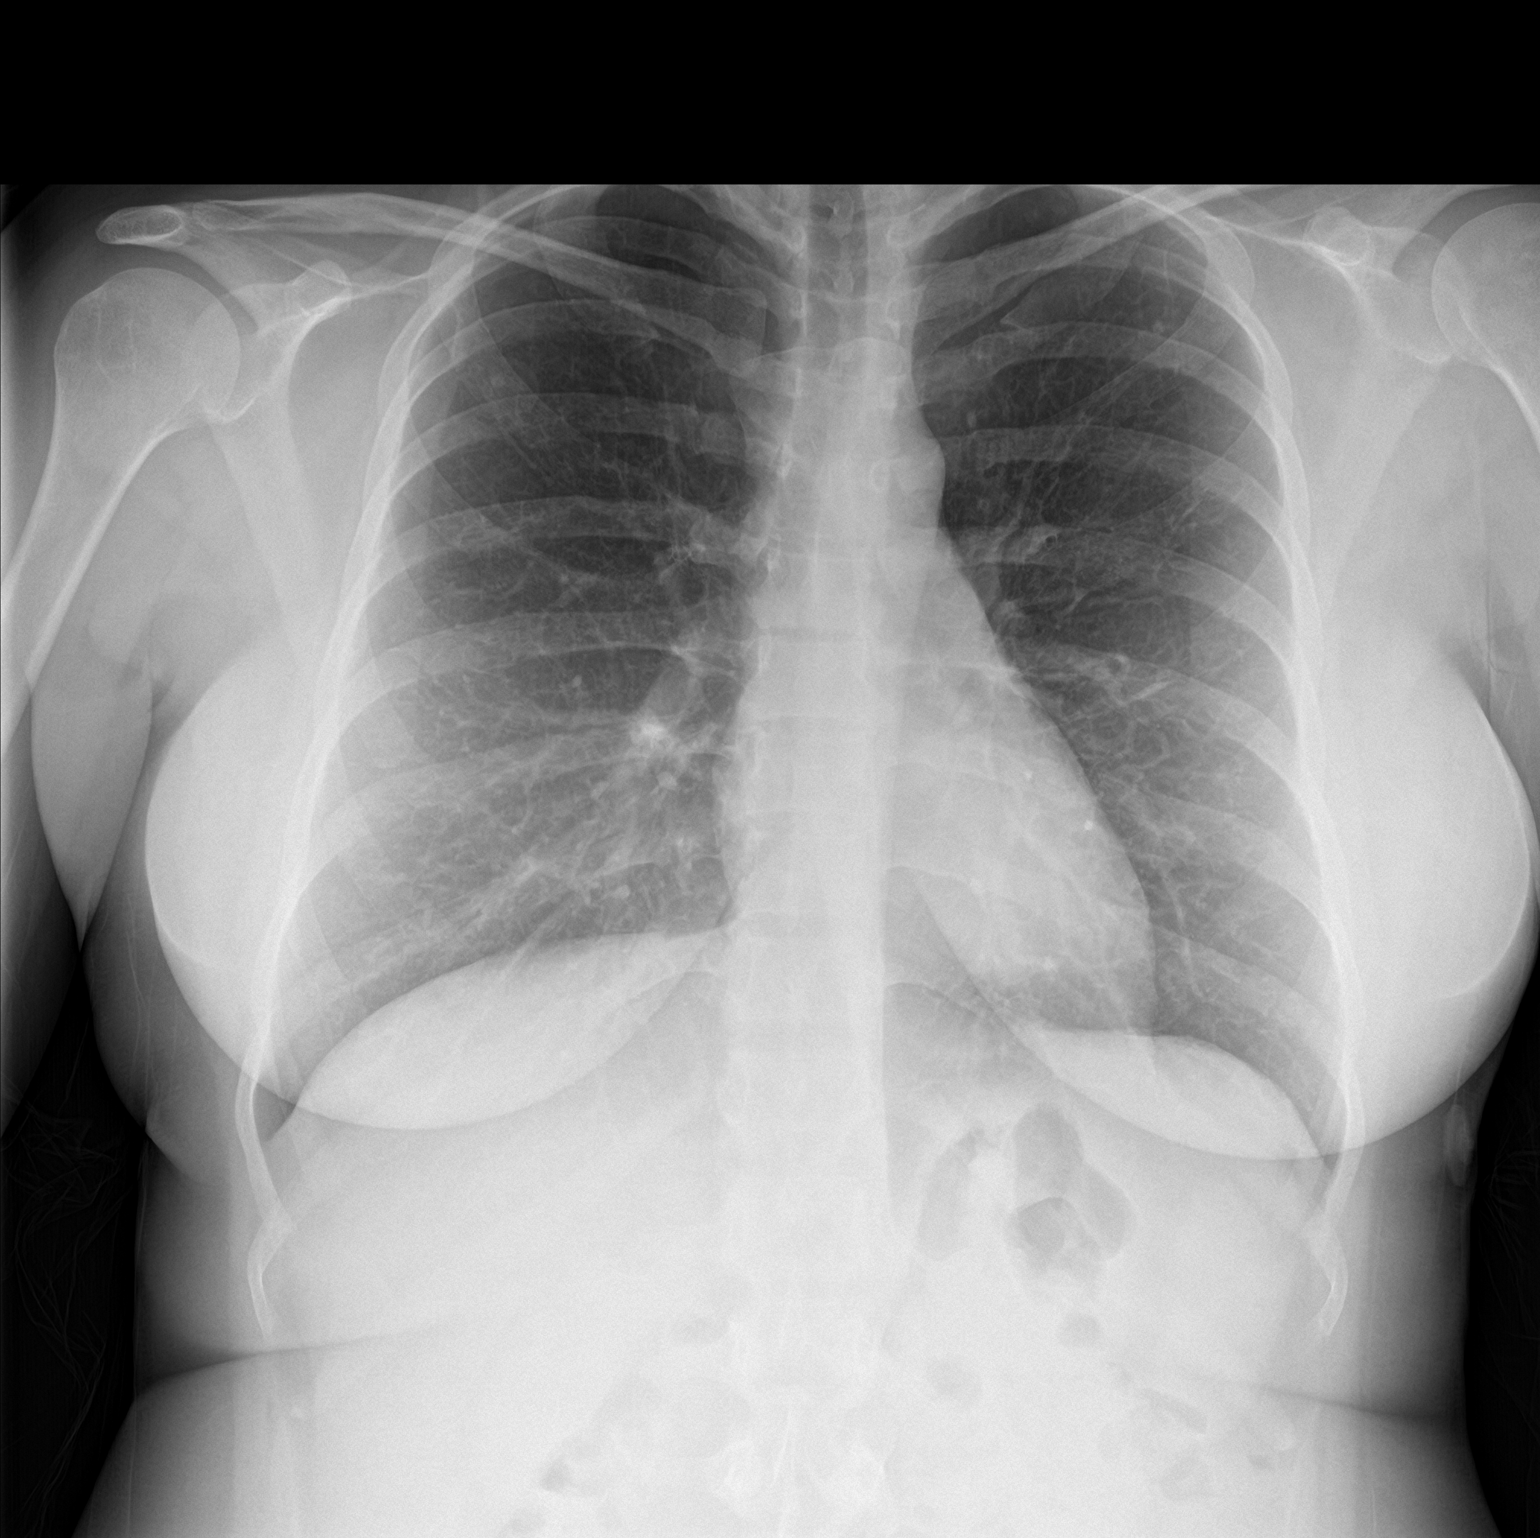

[chest lat]
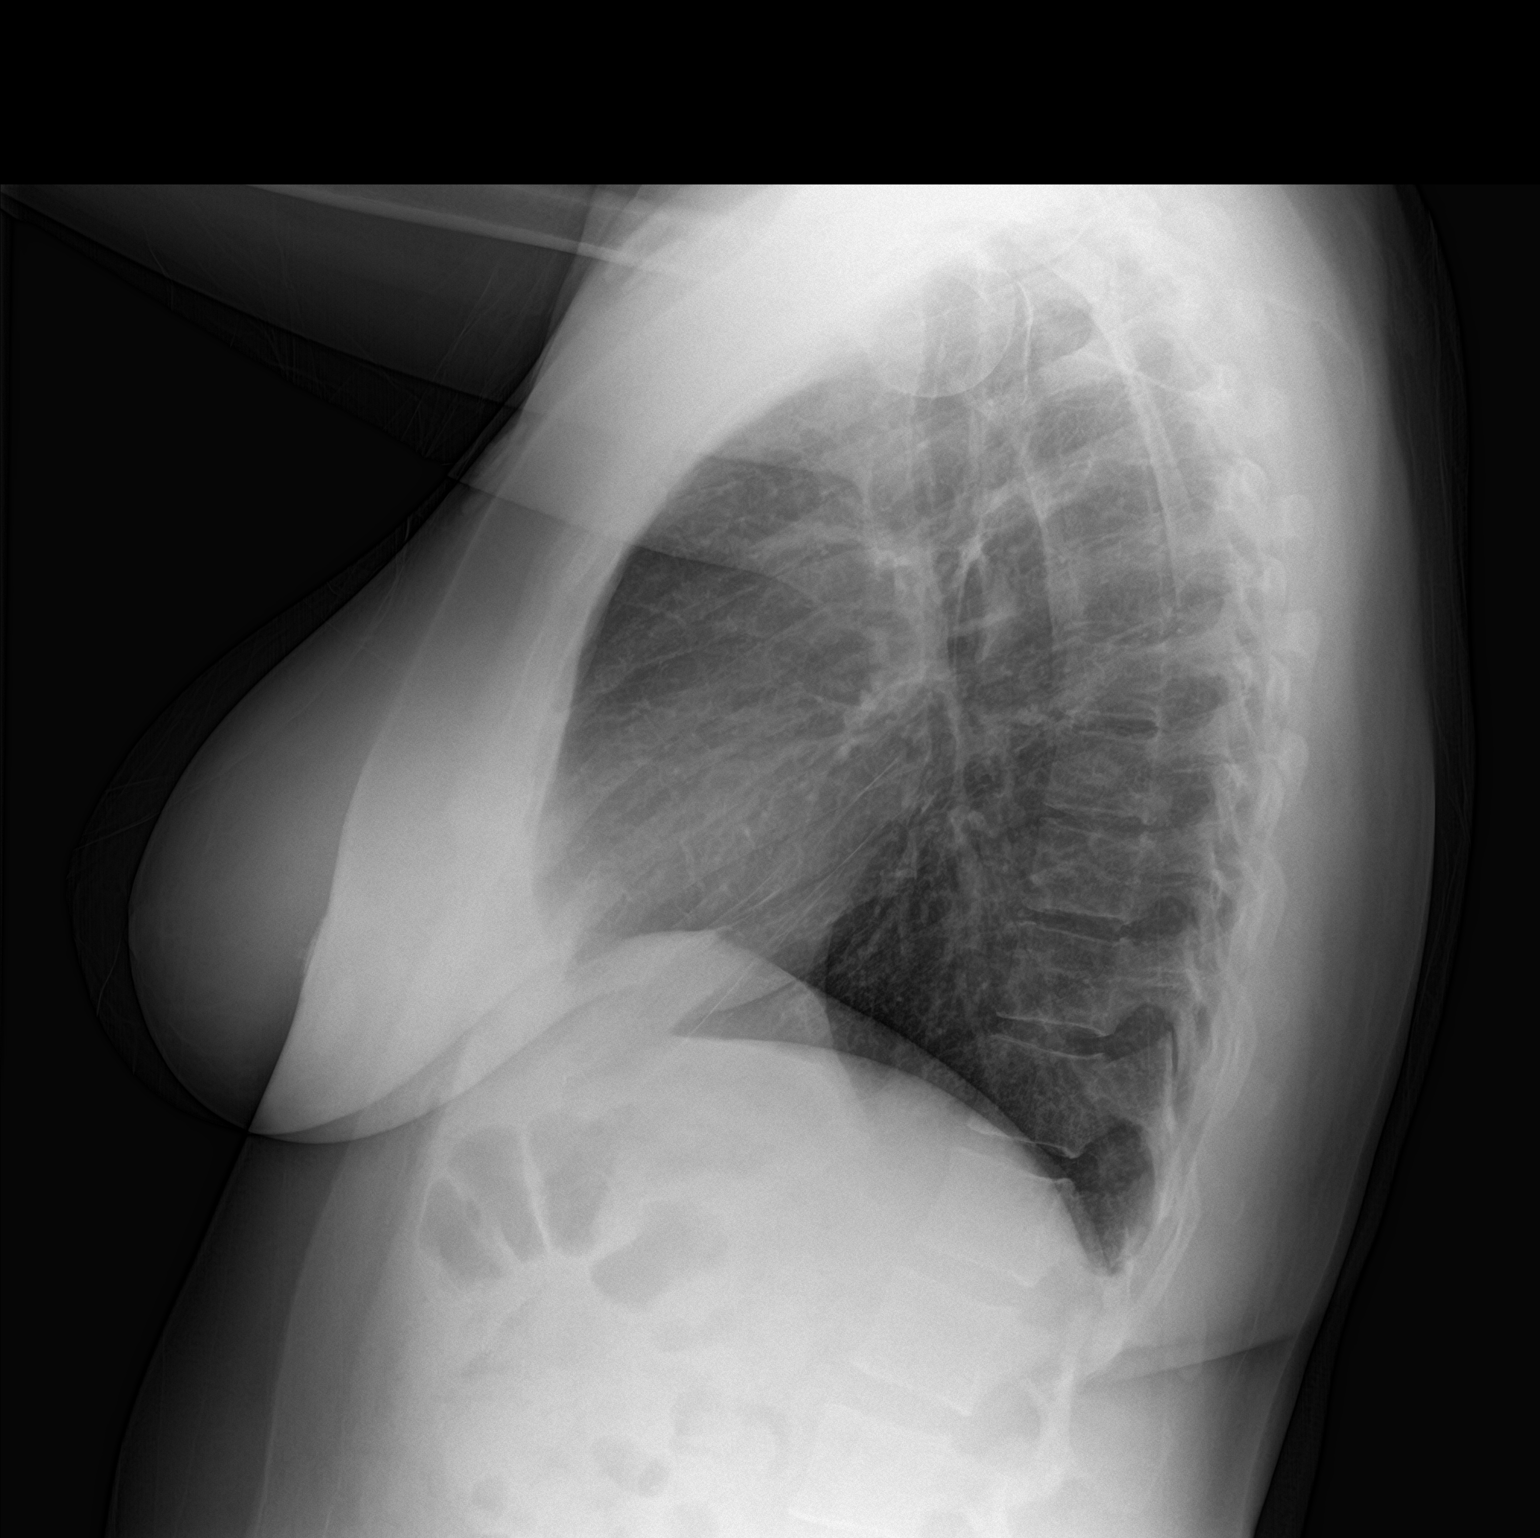

[2 of 2 positions shown; findings below may reference images not displayed]

FINDINGS: Heart and mediastinal contours are within normal limits. No focal
opacities or effusions. No acute bony abnormality.
IMPRESSION: No active cardiopulmonary disease.

## 2024-02-06 ENCOUNTER — Ambulatory Visit: Admission: EM | Admit: 2024-02-06 | Discharge: 2024-02-06 | Disposition: A | Discharge status: Home / Self Care

## 2024-02-06 DIAGNOSIS — J069 Acute upper respiratory infection, unspecified: Secondary | ICD-10-CM | POA: Diagnosis not present

## 2024-02-06 DIAGNOSIS — J45901 Unspecified asthma with (acute) exacerbation: Secondary | ICD-10-CM | POA: Diagnosis not present

## 2024-02-06 LAB — POC SARS CORONAVIRUS 2 AG -  ED: SARS Coronavirus 2 Ag: NEGATIVE

## 2024-02-06 MED ORDER — PSEUDOEPH-BROMPHEN-DM 30-2-10 MG/5ML PO SYRP: 5.0000 mL | ORAL_SOLUTION | Freq: Four times a day (QID) | ORAL | 0 refills | Status: DC | PRN

## 2024-02-06 MED ORDER — PREDNISONE 20 MG PO TABS: 40.0000 mg | ORAL_TABLET | Freq: Every day | ORAL | 0 refills | Status: AC

## 2024-02-06 MED ORDER — ALBUTEROL SULFATE HFA 108 (90 BASE) MCG/ACT IN AERS: 2.0000 | INHALATION_SPRAY | Freq: Four times a day (QID) | RESPIRATORY_TRACT | 0 refills | Status: AC | PRN

## 2024-02-06 MED ORDER — CETIRIZINE HCL 10 MG PO TABS: 10.0000 mg | ORAL_TABLET | Freq: Every day | ORAL | 0 refills | Status: AC

## 2024-02-06 NOTE — Discharge Instructions (Signed)
 The COVID test was negative. Take medication as prescribed. Increase fluids and allow for plenty of rest. May take over-the-counter Tylenol  or ibuprofen  as needed for pain, fever, or general discomfort. Warm salt water gargles 3-4 times daily as needed for throat pain or discomfort. Recommend normal saline nasal spray throughout the day for nasal congestion and runny nose. For your cough, recommend using humidifier in your bedroom at nighttime during sleep and sleeping elevated on pillows while symptoms persist. Follow-up with your primary care physician if symptoms fail to improve. Follow-up as needed.

## 2024-02-06 NOTE — ED Triage Notes (Signed)
 Pt reports cough, sinus drainage, raw throat, chest tightness, lack of energy x 1 day

## 2024-02-06 NOTE — ED Provider Notes (Signed)
 [facility]    CSN: [id number] Arrival date & time: 02/06/24  0903      History   Chief Complaint No chief complaint on file.   HPI Jenny Poole is a 43 y.o. female.   The history is provided by the patient.   Patient presents with a 1 day history of sinus drainage, sore throat, chest tightness, fatigue, and cough.  Patient also endorses wheezing.  She denies fever, chills, headache, ear pain, ear drainage, shortness of breath, chest pain, abdominal pain, nausea, vomiting, diarrhea, or rash.  Patient reports she has an underlying history of asthma.  States she has been using her albuterol  inhaler with minimal relief.  Past Medical History:  Diagnosis Date   Asthma    Thyroid disease     Patient Active Problem List   Diagnosis Date Noted   Thumb pain 03/24/2014   VAGINAL BLEEDING, ABNORMAL 08/02/2010   ABNORMAL GLANDULAR PAPANICOLAOU SMEAR OF CERVIX 08/02/2010   MENORRHAGIA 03/13/2010   Herpes simplex virus (HSV) infection 06/07/2009   TOBACCO ABUSE 09/03/2007   ANEMIA, IRON DEFICIENCY, UNSPEC. 12/05/2006   Anxiety state 12/05/2006   DEPRESSIVE DISORDER, NOS 12/05/2006   Allergic rhinitis 12/05/2006   ACNE 12/05/2006    Past Surgical History:  Procedure Laterality Date   CERVICAL BIOPSY  W/ LOOP ELECTRODE EXCISION     CESAREAN SECTION     CESAREAN SECTION     LASIK     uterine ablation     WISDOM TOOTH EXTRACTION      OB History   No obstetric history on file.      Home Medications    Prior to Admission medications   Medication Sig Start Date End Date Taking? Authorizing Provider  sertraline (ZOLOFT) 100 MG tablet Take 100 mg by mouth daily. 01/09/24  Yes [provider]  albuterol  (VENTOLIN  HFA) 108 (90 Base) MCG/ACT inhaler Inhale 2 puffs into the lungs every 6 (six) hours as needed. 02/06/24  Yes Leath-Warren, Belen Bowers, NP  brompheniramine-pseudoephedrine-DM 30-2-10 MG/5ML syrup Take 5 mLs by mouth 4 (four) times daily as needed.  02/06/24  Yes Leath-Warren, Belen Bowers, NP  cetirizine  (ZYRTEC ) 10 MG tablet Take 1 tablet (10 mg total) by mouth daily. 02/06/24  Yes Leath-Warren, Belen Bowers, NP  diazepam  (VALIUM ) 5 MG tablet Take 1 tablet (5 mg total) by mouth every 6 (six) hours as needed for anxiety (spasms). 05/31/15   Donn Fury, MD  ibuprofen  (ADVIL ,MOTRIN ) 800 MG tablet Take 1 tablet (800 mg total) by mouth 3 (three) times daily with meals. 06/19/17   Caccavale, Sophia, PA-C  levothyroxine (SYNTHROID, LEVOTHROID) 125 MCG tablet Take 125 mcg by mouth daily before breakfast.    [provider]  methocarbamol  (ROBAXIN ) 500 MG tablet Take 1 tablet (500 mg total) by mouth 2 (two) times daily as needed for muscle spasms. 06/19/17   Caccavale, Sophia, PA-C  predniSONE  (DELTASONE ) 20 MG tablet Take 2 tablets (40 mg total) by mouth daily with breakfast for 5 days. 02/06/24 02/11/24 Yes Leath-Warren, Belen Bowers, NP  traZODone (DESYREL) 50 MG tablet Take 25 mg by mouth at bedtime.    [provider]    Family History History reviewed. No pertinent family history.  Social History Social History   Tobacco Use   Smoking status: Former    Current packs/day: 0.50    Types: Cigarettes   Smokeless tobacco: Never  Substance Use Topics   Alcohol use: Yes    Comment: occ   Drug use:  No     Allergies   Penicillins and Dexamethasone sodium phosphate   Review of Systems Review of Systems Per HPI  Physical Exam Triage Vital Signs ED Triage Vitals  Encounter Vitals Group     BP 02/06/24 0927 111/67     Systolic BP Percentile --      Diastolic BP Percentile --      Pulse Rate 02/06/24 0927 82     Resp 02/06/24 0927 16     Temp 02/06/24 0927 98.9 F (37.2 C)     Temp Source 02/06/24 0927 Oral     SpO2 02/06/24 0927 98 %     Weight --      Height --      Head Circumference --      Peak Flow --      Pain Score 02/06/24 0931 0     Pain Loc --      Pain Education --      Exclude from Growth Chart --     No data found.  Updated Vital Signs BP 111/67 (BP Location: Right Arm)   Pulse 82   Temp 98.9 F (37.2 C) (Oral)   Resp 16   SpO2 98%   Visual Acuity Right Eye Distance:   Left Eye Distance:   Bilateral Distance:    Right Eye Near:   Left Eye Near:    Bilateral Near:     Physical Exam Vitals and nursing note reviewed.  Constitutional:      General: She is not in acute distress.    Appearance: Normal appearance.  HENT:     Head: Normocephalic.     Right Ear: Tympanic membrane, ear canal and external ear normal.     Left Ear: Tympanic membrane, ear canal and external ear normal.     Nose: Congestion present.     Mouth/Throat:     Lips: Pink.     Mouth: Mucous membranes are moist.     Pharynx: Oropharynx is clear. Uvula midline. Posterior oropharyngeal erythema and postnasal drip present. No pharyngeal swelling.     Comments: Cobblestoning present to posterior oropharynx  Eyes:     Extraocular Movements: Extraocular movements intact.     Conjunctiva/sclera: Conjunctivae normal.     Pupils: Pupils are equal, round, and reactive to light.  Cardiovascular:     Rate and Rhythm: Normal rate and regular rhythm.     Pulses: Normal pulses.     Heart sounds: Normal heart sounds.  Pulmonary:     Effort: Pulmonary effort is normal. No respiratory distress.     Breath sounds: Normal breath sounds. No stridor. No wheezing, rhonchi or rales.  Abdominal:     General: Bowel sounds are normal.     Palpations: Abdomen is soft.     Tenderness: There is no abdominal tenderness.  Musculoskeletal:     Cervical back: Normal range of motion.  Lymphadenopathy:     Cervical: No cervical adenopathy.  Skin:    General: Skin is warm and dry.  Neurological:     General: No focal deficit present.     Mental Status: She is alert and oriented to person, place, and time.  Psychiatric:        Mood and Affect: Mood normal.        Behavior: Behavior normal.      UC Treatments / Results   Labs (all labs ordered are listed, but only abnormal results are displayed) Labs Reviewed  POC SARS CORONAVIRUS 2 AG -  ED    EKG   Radiology No results found.  Procedures Procedures (including critical care time)  Medications Ordered in UC Medications - No data to display  Initial Impression / Assessment and Plan / UC Course  I have reviewed the triage vital signs and the nursing notes.  Pertinent labs & imaging results that were available during my care of the patient were reviewed by me and considered in my medical decision making (see chart for details).  COVID test is negative.  Patient with underlying history of asthma.  On exam, lung sounds are clear throughout, room air sats at 98%.  Suspect viral URI that is exacerbating her asthma.  Will treat with prednisone  40 mg for the next 5 days, and albuterol  inhaler as needed for wheezing, Bromfed-DM for the cough, and cetirizine  10 mg as an antihistamine. Supportive care recommendations were provided and discussed with the patient to include fluids, rest, over-the-counter analgesics, normal saline nasal spray, warm salt water gargles, use of a humidifier during sleep.  Discussed indications with patient regarding follow-up.  Patient was in agreement with this plan of care and verbalized understanding.  All questions were answered.  Patient stable for discharge.  Work note was provided.  Final Clinical Impressions(s) / UC Diagnoses   Final diagnoses:  Viral URI with cough  Asthma with acute exacerbation, unspecified asthma severity, unspecified whether persistent     Discharge Instructions      The COVID test was negative. Take medication as prescribed. Increase fluids and allow for plenty of rest. May take over-the-counter Tylenol  or ibuprofen  as needed for pain, fever, or general discomfort. Warm salt water gargles 3-4 times daily as needed for throat pain or discomfort. Recommend normal saline nasal spray throughout the  day for nasal congestion and runny nose. For your cough, recommend using humidifier in your bedroom at nighttime during sleep and sleeping elevated on pillows while symptoms persist. Follow-up with your primary care physician if symptoms fail to improve. Follow-up as needed.     ED Prescriptions     Medication Sig Dispense Auth. Provider   predniSONE  (DELTASONE ) 20 MG tablet Take 2 tablets (40 mg total) by mouth daily with breakfast for 5 days. 10 tablet Leath-Warren, Belen Bowers, NP   brompheniramine-pseudoephedrine-DM 30-2-10 MG/5ML syrup Take 5 mLs by mouth 4 (four) times daily as needed. 140 mL Leath-Warren, Belen Bowers, NP   albuterol  (VENTOLIN  HFA) 108 (90 Base) MCG/ACT inhaler Inhale 2 puffs into the lungs every 6 (six) hours as needed. 8 g Leath-Warren, Belen Bowers, NP   cetirizine  (ZYRTEC ) 10 MG tablet Take 1 tablet (10 mg total) by mouth daily. 30 tablet Leath-Warren, Belen Bowers, NP      PDMP not reviewed this encounter.   Hardy Lia, NP 02/06/24 1012

## 2024-03-15 ENCOUNTER — Encounter: Payer: Self-pay | Admitting: Emergency Medicine

## 2024-03-15 ENCOUNTER — Ambulatory Visit
Admission: EM | Admit: 2024-03-15 | Discharge: 2024-03-15 | Disposition: A | Discharge status: Home / Self Care | Attending: Nurse Practitioner | Admitting: Nurse Practitioner

## 2024-03-15 DIAGNOSIS — U071 COVID-19: Secondary | ICD-10-CM

## 2024-03-15 LAB — POC SARS CORONAVIRUS 2 AG -  ED: SARS Coronavirus 2 Ag: POSITIVE — AB

## 2024-03-15 MED ORDER — IBUPROFEN 800 MG PO TABS
800.0000 mg | ORAL_TABLET | Freq: Once | ORAL | Status: AC
  Administered 2024-03-15: 800 mg via ORAL

## 2024-03-15 MED ORDER — ONDANSETRON 4 MG PO TBDP: 4.0000 mg | ORAL_TABLET | Freq: Three times a day (TID) | ORAL | 0 refills | Status: AC | PRN

## 2024-03-15 MED ORDER — ONDANSETRON 4 MG PO TBDP
4.0000 mg | ORAL_TABLET | Freq: Once | ORAL | Status: AC
  Administered 2024-03-15: 4 mg via ORAL

## 2024-03-15 MED ORDER — PROMETHAZINE-DM 6.25-15 MG/5ML PO SYRP
5.0000 mL | ORAL_SOLUTION | Freq: Four times a day (QID) | ORAL | 0 refills | Status: AC | PRN
Start: 2024-03-15 — End: ?

## 2024-03-15 MED ORDER — ALBUTEROL SULFATE (2.5 MG/3ML) 0.083% IN NEBU
2.5000 mg | INHALATION_SOLUTION | Freq: Once | RESPIRATORY_TRACT | Status: AC
Start: 2024-03-15 — End: 2024-03-15
  Administered 2024-03-15: 2.5 mg via RESPIRATORY_TRACT

## 2024-03-15 NOTE — ED Provider Notes (Signed)
 [facility]    CSN: [id number] Arrival date & time: 03/15/24  1242      History   Chief Complaint No chief complaint on file.   HPI Jenny Poole is a 43 y.o. female.   The history is provided by the patient.   Patient presents for complaints of fever, chills, body aches, fatigue, headache, dizziness, cough, nausea, and vomiting.  Patient states symptoms started 1 day ago.  Reports 1 episode of nausea and vomiting this morning.  Continues to feel nauseated.  She also complains of chest tightness.  She denies ear pain, ear drainage, chest pain, abdominal pain, diarrhea, or rash.  Patient reports underlying history of seasonal allergies and asthma.  States she has used her albuterol  inhaler for symptoms.  States she has not taken any other medications. Past Medical History:  Diagnosis Date   Asthma    Thyroid disease     Patient Active Problem List   Diagnosis Date Noted   Thumb pain 03/24/2014   VAGINAL BLEEDING, ABNORMAL 08/02/2010   ABNORMAL GLANDULAR PAPANICOLAOU SMEAR OF CERVIX 08/02/2010   MENORRHAGIA 03/13/2010   Herpes simplex virus (HSV) infection 06/07/2009   TOBACCO ABUSE 09/03/2007   ANEMIA, IRON DEFICIENCY, UNSPEC. 12/05/2006   Anxiety state 12/05/2006   DEPRESSIVE DISORDER, NOS 12/05/2006   Allergic rhinitis 12/05/2006   ACNE 12/05/2006    Past Surgical History:  Procedure Laterality Date   CERVICAL BIOPSY  W/ LOOP ELECTRODE EXCISION     CESAREAN SECTION     CESAREAN SECTION     LASIK     uterine ablation     WISDOM TOOTH EXTRACTION      OB History   No obstetric history on file.      Home Medications    Prior to Admission medications   Medication Sig Start Date End Date Taking? Authorizing Provider  valACYclovir (VALTREX) 500 MG tablet Take 500 mg by mouth 2 (two) times daily.   Yes [provider]  albuterol  (VENTOLIN  HFA) 108 (90 Base) MCG/ACT inhaler Inhale 2 puffs into the lungs every 6 (six) hours as needed. 02/06/24    Leath-Warren, Belen Bowers, NP  cetirizine  (ZYRTEC ) 10 MG tablet Take 1 tablet (10 mg total) by mouth daily. 02/06/24   Leath-Warren, Belen Bowers, NP  diazepam  (VALIUM ) 5 MG tablet Take 1 tablet (5 mg total) by mouth every 6 (six) hours as needed for anxiety (spasms). 05/31/15   Donn Fury, MD  ibuprofen  (ADVIL ,MOTRIN ) 800 MG tablet Take 1 tablet (800 mg total) by mouth 3 (three) times daily with meals. 06/19/17   Caccavale, Sophia, PA-C  levothyroxine (SYNTHROID, LEVOTHROID) 125 MCG tablet Take 125 mcg by mouth daily before breakfast.    [provider]  methocarbamol  (ROBAXIN ) 500 MG tablet Take 1 tablet (500 mg total) by mouth 2 (two) times daily as needed for muscle spasms. 06/19/17   Caccavale, Sophia, PA-C  sertraline (ZOLOFT) 100 MG tablet Take 100 mg by mouth daily. 01/09/24   [provider]  traZODone (DESYREL) 50 MG tablet Take 25 mg by mouth at bedtime.    [provider]    Family History History reviewed. No pertinent family history.  Social History Social History   Tobacco Use   Smoking status: Former    Current packs/day: 0.50    Types: Cigarettes   Smokeless tobacco: Never  Substance Use Topics   Alcohol use: Yes    Comment: occ   Drug use: No     Allergies  Penicillins and Dexamethasone sodium phosphate   Review of Systems Review of Systems Per HPI  Physical Exam Triage Vital Signs ED Triage Vitals [03/15/24 1315]  Encounter Vitals Group     BP 121/87     Systolic BP Percentile      Diastolic BP Percentile      Pulse Rate (!) 127     Resp 18     Temp (!) 101.2 F (38.4 C)     Temp Source Oral     SpO2 95 %     Weight      Height      Head Circumference      Peak Flow      Pain Score 8     Pain Loc      Pain Education      Exclude from Growth Chart    No data found.  Updated Vital Signs BP 121/87 (BP Location: Right Arm)   Pulse (!) 127   Temp (!) 101.2 F (38.4 C) (Oral)   Resp 18   SpO2 95%   Visual  Acuity Right Eye Distance:   Left Eye Distance:   Bilateral Distance:    Right Eye Near:   Left Eye Near:    Bilateral Near:     Physical Exam Vitals and nursing note reviewed.  Constitutional:      General: She is not in acute distress.    Appearance: Normal appearance.  HENT:     Head: Normocephalic.     Right Ear: Tympanic membrane, ear canal and external ear normal.     Left Ear: Tympanic membrane, ear canal and external ear normal.     Nose: Congestion present.     Mouth/Throat:     Mouth: Mucous membranes are moist.  Eyes:     Extraocular Movements: Extraocular movements intact.     Pupils: Pupils are equal, round, and reactive to light.  Cardiovascular:     Rate and Rhythm: Regular rhythm. Tachycardia present.     Pulses: Normal pulses.     Heart sounds: Normal heart sounds.  Pulmonary:     Effort: Pulmonary effort is normal. No respiratory distress.     Breath sounds: Normal breath sounds. No stridor. No wheezing, rhonchi or rales.  Abdominal:     General: Bowel sounds are normal.     Palpations: Abdomen is soft.     Tenderness: There is no abdominal tenderness.  Musculoskeletal:     Cervical back: Normal range of motion.  Skin:    General: Skin is warm and dry.  Neurological:     General: No focal deficit present.     Mental Status: She is alert and oriented to person, place, and time.  Psychiatric:        Mood and Affect: Mood normal.        Behavior: Behavior normal.     UC Treatments / Results  Labs (all labs ordered are listed, but only abnormal results are displayed) Labs Reviewed  POC SARS CORONAVIRUS 2 AG -  ED  POCT RAPID STREP A (OFFICE)    EKG   Radiology No results found.  Procedures Procedures (including critical care time)  Medications Ordered in UC Medications  ibuprofen  (ADVIL ) tablet 800 mg (800 mg Oral Given 03/15/24 1320)    Initial Impression / Assessment and Plan / UC Course  I have reviewed the triage vital signs and  the nursing notes.  Pertinent labs & imaging results that were available during my care  of the patient were reviewed by me and considered in my medical decision making (see chart for details).  COVID test was positive.  Patient declines antiviral therapy.  During triage, patient was febrile, ibuprofen  was administered.  Nebulizer treatment was also administered for chest tightness and due to underlying history of asthma..  Will provide symptomatic treatment with Promethazine DM for the cough and ondansetron 4 mg ODT for nausea and vomiting.  Supportive care recommendations were provided and discussed with the patient to include fluids, rest, over-the-counter analgesics, and use of a humidifier during sleep.  Patient was advised she will need to remain home until she has been fever free for 24 hours with no medication.  Patient was also given strict ER follow-up precautions.  Patient was in agreement with this plan of care and verbalizes understanding.  All questions were answered.  Patient stable for discharge.   Final Clinical Impressions(s) / UC Diagnoses   Final diagnoses:  None   Discharge Instructions   None    ED Prescriptions   None    PDMP not reviewed this encounter.   Hardy Lia, NP 03/15/24 1510

## 2024-03-15 NOTE — ED Triage Notes (Signed)
 Cough, fever, body aches, headache, dizzy, vomiting since yesterday

## 2024-03-15 NOTE — Discharge Instructions (Signed)
 Your COVID test was positive today. You were given ondansetron 4 mg for your nausea, an albuterol  nebulizer treatment, and ibuprofen  for fever. Take medication as prescribed. Increase fluids allow for plenty of rest.  Try to drink at least 8-10 8 ounce glasses of water daily.  If there is concern for dehydration, recommend Pedialyte or Gatorade. Recommend use of a humidifier in your bedroom at nighttime during sleep and sleeping elevated on pillows while cough symptoms persist. Go to the emergency department immediately if you experience worsening fever, cough, shortness of breath, or other concerns. Follow-up as needed.
